# Patient Record
Sex: Male | Born: 1950 | Race: Black or African American | Hispanic: No | Marital: Married | Smoking: Never smoker
Health system: Southern US, Community
[De-identification: ages and names within clinical notes are randomized; demographics above are authoritative.]

## PROBLEM LIST (undated history)

## (undated) DIAGNOSIS — E785 Hyperlipidemia, unspecified: Secondary | ICD-10-CM

## (undated) DIAGNOSIS — I10 Essential (primary) hypertension: Secondary | ICD-10-CM

---
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
## 2015-02-01 DIAGNOSIS — I10 Essential (primary) hypertension: Secondary | ICD-10-CM | POA: Insufficient documentation

## 2015-02-01 DIAGNOSIS — E785 Hyperlipidemia, unspecified: Secondary | ICD-10-CM | POA: Insufficient documentation

## 2015-02-01 HISTORY — DX: Hyperlipidemia, unspecified: E78.5

## 2015-02-01 HISTORY — DX: Essential (primary) hypertension: I10

## 2015-08-17 DIAGNOSIS — R972 Elevated prostate specific antigen [PSA]: Secondary | ICD-10-CM | POA: Insufficient documentation

## 2015-08-17 DIAGNOSIS — N400 Enlarged prostate without lower urinary tract symptoms: Secondary | ICD-10-CM | POA: Insufficient documentation

## 2015-08-17 HISTORY — DX: Benign prostatic hyperplasia without lower urinary tract symptoms: N40.0

## 2016-02-08 DIAGNOSIS — J3089 Other allergic rhinitis: Secondary | ICD-10-CM | POA: Insufficient documentation

## 2016-03-23 ENCOUNTER — Encounter: Payer: Self-pay | Admitting: Emergency Medicine

## 2016-03-23 ENCOUNTER — Emergency Department (INDEPENDENT_AMBULATORY_CARE_PROVIDER_SITE_OTHER)
Admission: EM | Admit: 2016-03-23 | Discharge: 2016-03-23 | Disposition: A | Discharge status: Home / Self Care | Payer: Medicare Other | Source: Home / Self Care | Attending: Family Medicine | Admitting: Family Medicine

## 2016-03-23 DIAGNOSIS — B9789 Other viral agents as the cause of diseases classified elsewhere: Secondary | ICD-10-CM

## 2016-03-23 DIAGNOSIS — J069 Acute upper respiratory infection, unspecified: Secondary | ICD-10-CM

## 2016-03-23 DIAGNOSIS — R0981 Nasal congestion: Secondary | ICD-10-CM

## 2016-03-23 HISTORY — DX: Hyperlipidemia, unspecified: E78.5

## 2016-03-23 MED ORDER — IPRATROPIUM BROMIDE 0.06 % NA SOLN: 2.0000 | Freq: Four times a day (QID) | NASAL | 1 refills | Status: DC

## 2016-03-23 MED ORDER — AZITHROMYCIN 250 MG PO TABS: 250.0000 mg | ORAL_TABLET | Freq: Every day | ORAL | 0 refills | Status: DC

## 2016-03-23 MED ORDER — BENZONATATE 100 MG PO CAPS: 100.0000 mg | ORAL_CAPSULE | Freq: Three times a day (TID) | ORAL | 0 refills | Status: DC

## 2016-03-23 NOTE — Discharge Instructions (Signed)
°  For the Ipratropium nasal spray, be sure to use as prescribed to help prevent post-nasal drip, which can trigger coughing, especially at night. ° °Use 2 sprays per nostril 4 times daily while sick.  You should spray one time in each nostril pointing the spray to the out portion of your nostril, breath in slowly while spraying. Wait about 30 seconds to 1 minute before given the second spray in each nostril.  This will ensure you get the most benefit from each spray.   ° °Your symptoms are likely due to a virus such as the common cold, however, if you developing worsening chest congestion with shortness of breath, persistent fever for 3 days, or symptoms not improving in 4-5 days, you may fill the antibiotic (azithromycin).  If you do fill the antibiotic,  please take antibiotics as prescribed and be sure to complete entire course even if you start to feel better to ensure infection does not come back. ° °

## 2016-03-23 NOTE — ED Provider Notes (Signed)
CSN: [id number]     Arrival date & time 03/23/16  1822 History   First MD Initiated Contact with Patient 03/23/16 1840     Chief Complaint  Patient presents with  . URI   (Consider location/radiation/quality/duration/timing/severity/associated sxs/prior Treatment) HPI  Delvecchio MARCELL CHAVARIN is a 66 y.o. male presenting to UC with c/o 4 days of mildly persistent rhinorrhea, mild sore throat, sneezing, and chest tightness for about 4 days. He has tried OTC cough/cold medication including sudafed and mucinex w/o relief. Denies fever, chills, n/v/d. Denies body aches. He was around someone this past weekend that was sick. No other sick contacts.    Past Medical History:  Diagnosis Date  . Hyperlipidemia    History reviewed. No pertinent surgical history. No family history on file. Social History  Substance Use Topics  . Smoking status: Never Smoker  . Smokeless tobacco: Never Used  . Alcohol use Yes    Review of Systems  Constitutional: Negative for chills and fever.  HENT: Positive for congestion, rhinorrhea and sneezing. Negative for ear pain, sinus pain, sinus pressure, sore throat, trouble swallowing and voice change.   Respiratory: Positive for cough. Negative for shortness of breath.   Cardiovascular: Negative for chest pain and palpitations.  Gastrointestinal: Negative for abdominal pain, diarrhea, nausea and vomiting.  Musculoskeletal: Negative for arthralgias, back pain and myalgias.  Skin: Negative for rash.  Neurological: Negative for dizziness, light-headedness and headaches.    Allergies  Patient has no known allergies.  Home Medications   Prior to Admission medications   Medication Sig Start Date End Date Taking? Authorizing Provider  aspirin EC 81 MG tablet Take 81 mg by mouth daily.   Yes Historical Provider, MD  atorvastatin (LIPITOR) 40 MG tablet Take 40 mg by mouth daily.   Yes Historical Provider, MD  budesonide-formoterol (SYMBICORT) 80-4.5 MCG/ACT inhaler  Inhale 2 puffs into the lungs 2 (two) times daily.   Yes Historical Provider, MD  guaiFENesin (MUCINEX) 600 MG 12 hr tablet Take by mouth 2 (two) times daily.   Yes Historical Provider, MD  hydrochlorothiazide (HYDRODIURIL) 25 MG tablet Take 25 mg by mouth daily.   Yes Historical Provider, MD  azithromycin (ZITHROMAX) 250 MG tablet Take 1 tablet (250 mg total) by mouth daily. Take first 2 tablets together, then 1 every day until finished. 03/23/16   Junius Finner, PA-C  benzonatate (TESSALON) 100 MG capsule Take 1-2 capsules (100-200 mg total) by mouth every 8 (eight) hours. 03/23/16   Junius Finner, PA-C  ipratropium (ATROVENT) 0.06 % nasal spray Place 2 sprays into both nostrils 4 (four) times daily. For 1 week 03/23/16   Junius Finner, PA-C   Meds Ordered and Administered this Visit  Medications - No data to display  BP 160/80 (BP Location: Left Arm)   Pulse 75   Temp 97.9 F (36.6 C) (Oral)   Ht 6' (1.829 m)   Wt 236 lb (107 kg)   SpO2 96%   BMI 32.01 kg/m  No data found.   Physical Exam  Constitutional: He is oriented to person, place, and time. He appears well-developed and well-nourished. No distress.  HENT:  Head: Normocephalic and atraumatic.  Right Ear: Tympanic membrane normal.  Left Ear: Tympanic membrane normal.  Nose: Mucosal edema and rhinorrhea present. Right sinus exhibits no maxillary sinus tenderness and no frontal sinus tenderness. Left sinus exhibits no maxillary sinus tenderness and no frontal sinus tenderness.  Mouth/Throat: Uvula is midline, oropharynx is clear and moist and mucous membranes are  normal.  Eyes: EOM are normal.  Neck: Normal range of motion. Neck supple.  Cardiovascular: Normal rate and regular rhythm.   Pulmonary/Chest: Effort normal and breath sounds normal. No stridor. No respiratory distress. He has no wheezes. He has no rales.  Musculoskeletal: Normal range of motion.  Lymphadenopathy:    He has no cervical adenopathy.  Neurological: He is  alert and oriented to person, place, and time.  Skin: Skin is warm and dry. He is not diaphoretic.  Psychiatric: He has a normal mood and affect. His behavior is normal.  Nursing note and vitals reviewed.   Urgent Care Course     Procedures (including critical care time)  Labs Review Labs Reviewed - No data to display  Imaging Review No results found.    MDM   1. Viral URI with cough   2. Sinus congestion    Symptoms likely viral, no evidence of bacterial infection at this time.  Encouraged symptomatic treatment.  Rx: tessalon, ipratropium nasal spray. Prescription to hold with expiration date for azithromycin. Pt to fill if persistent fever develops or not improving in 1 week.     Junius Finnerrin O'Malley, PA-C 03/23/16 [phone]

## 2016-03-23 NOTE — ED Triage Notes (Signed)
Runny nose, congestion, sneezing, cough, chest tightness x 4 days

## 2018-07-04 ENCOUNTER — Emergency Department (INDEPENDENT_AMBULATORY_CARE_PROVIDER_SITE_OTHER)
Admission: EM | Admit: 2018-07-04 | Discharge: 2018-07-04 | Disposition: A | Discharge status: Home / Self Care | Payer: Medicare Other | Source: Home / Self Care | Attending: Family Medicine | Admitting: Family Medicine

## 2018-07-04 ENCOUNTER — Other Ambulatory Visit: Payer: Self-pay

## 2018-07-04 ENCOUNTER — Encounter: Payer: Self-pay | Admitting: Emergency Medicine

## 2018-07-04 ENCOUNTER — Emergency Department (INDEPENDENT_AMBULATORY_CARE_PROVIDER_SITE_OTHER): Payer: Medicare Other

## 2018-07-04 DIAGNOSIS — M4317 Spondylolisthesis, lumbosacral region: Secondary | ICD-10-CM

## 2018-07-04 DIAGNOSIS — M5442 Lumbago with sciatica, left side: Secondary | ICD-10-CM

## 2018-07-04 MED ORDER — PREDNISONE 20 MG PO TABS: ORAL_TABLET | ORAL | 0 refills | Status: DC

## 2018-07-04 MED ORDER — HYDROCODONE-ACETAMINOPHEN 5-325 MG PO TABS: ORAL_TABLET | ORAL | 0 refills | Status: DC

## 2018-07-04 NOTE — ED Triage Notes (Signed)
Left side sciatica x 6 days

## 2018-07-04 NOTE — Discharge Instructions (Addendum)
Apply ice pack to lower back for 20 to 30 minutes, 3 to 4 times daily  Continue until pain decreases.  May take Tylenol daytime as needed for pain.

## 2018-07-04 NOTE — ED Provider Notes (Signed)
[facility]    CSN: [id number] Arrival date & time: 07/04/18  0901     History   Chief Complaint Chief Complaint  Patient presents with  . Sciatica    HPI Dillon Jackson is a 68 y.o. male.   Patient reports that after working in his yard five days ago he developed left-side lower back ache that began radiating into his left anterior thigh.  The pain is better when sitting and worse when walking.  He denies bowel or bladder dysfunction, and no saddle numbness.  Aleve has not been helpful.  He feels well otherwise. He states that he had sciatic pain about 10 years ago.   The history is provided by the patient.  Back Pain  Location:  Lumbar spine and gluteal region Quality:  Aching Radiates to:  L thigh Pain severity:  Moderate Pain is:  Worse during the night Onset quality:  Sudden Duration:  5 days Timing:  Constant Progression:  Unchanged Chronicity:  Recurrent Context comment:  Doing yard work Relieved by:  Nothing Exacerbated by: walking. Ineffective treatments:  NSAIDs Associated symptoms: leg pain and paresthesias   Associated symptoms: no abdominal pain, no bladder incontinence, no bowel incontinence, no chest pain, no dysuria, no fever, no numbness, no pelvic pain, no perianal numbness, no tingling, no weakness and no weight loss     Past Medical History:  Diagnosis Date  . Hyperlipidemia     Active problems:  Hyperlipidemia, essential hypertension, BPH, allergic rhinitis.   Surgical history:  Not listed by patient.     Home Medications    Prior to Admission medications   Medication Sig Start Date End Date Taking? Authorizing Provider  aspirin EC 81 MG tablet Take 81 mg by mouth daily.    [provider]  atorvastatin (LIPITOR) 40 MG tablet Take 40 mg by mouth daily.    [provider]  budesonide-formoterol (SYMBICORT) 80-4.5 MCG/ACT inhaler Inhale 2 puffs into the lungs 2 (two) times daily.    [provider]   hydrochlorothiazide (HYDRODIURIL) 25 MG tablet Take 25 mg by mouth daily.    [provider]  HYDROcodone-acetaminophen (NORCO/VICODIN) 5-325 MG tablet Take one by mouth at bedtime as needed for pain.  May repeat in 4 to 6 hours prn 07/04/18   Lattie Haw, MD  predniSONE (DELTASONE) 20 MG tablet Take one tab by mouth twice daily for 4 days, then one daily for 3 days. Take with food. 07/04/18   Lattie Haw, MD    Family History Prostate CA  Social History Social History   Tobacco Use  . Smoking status: Never Smoker  . Smokeless tobacco: Never Used  Substance Use Topics  . Alcohol use: Yes  . Drug use: Not on file     Allergies   Patient has no known allergies.   Review of Systems Review of Systems  Constitutional: Negative for chills, diaphoresis, fatigue, fever and weight loss.  Cardiovascular: Negative for chest pain.  Gastrointestinal: Negative for abdominal pain and bowel incontinence.  Genitourinary: Negative for bladder incontinence, dysuria and pelvic pain.  Musculoskeletal: Positive for back pain.  Neurological: Positive for paresthesias. Negative for tingling, weakness and numbness.  All other systems reviewed and are negative.    Physical Exam Triage Vital Signs ED Triage Vitals  Enc Vitals Group     BP      Pulse      Resp      Temp      Temp  src      SpO2      Weight      Height      Head Circumference      Peak Flow      Pain Score      Pain Loc      Pain Edu?      Excl. in GC?    No data found.  Updated Vital Signs BP (!) 164/84 (BP Location: Right Arm)   Pulse (!) 52   Temp 98.5 F (36.9 C) (Oral)   Ht 6' (1.829 m)   Wt 104.3 kg   SpO2 98%   BMI 31.19 kg/m   Visual Acuity Right Eye Distance:   Left Eye Distance:   Bilateral Distance:    Right Eye Near:   Left Eye Near:    Bilateral Near:     Physical Exam Vitals signs and nursing note reviewed.  Constitutional:      General: He is not in acute distress.  HENT:     Head: Normocephalic.     Right Ear: External ear normal.     Left Ear: External ear normal.     Nose: Nose normal.     Mouth/Throat:     Pharynx: Oropharynx is clear.  Eyes:     Conjunctiva/sclera: Conjunctivae normal.     Pupils: Pupils are equal, round, and reactive to light.  Neck:     Musculoskeletal: Normal range of motion.  Cardiovascular:     Rate and Rhythm: Normal rate.     Heart sounds: Normal heart sounds.  Pulmonary:     Effort: Pulmonary effort is normal.     Breath sounds: Normal breath sounds.  Abdominal:     Tenderness: There is no abdominal tenderness.  Musculoskeletal:       Back:     Right lower leg: No edema.     Left lower leg: No edema.     Comments: Back:  Range of motion decreased. Can heel/toe walk and squat without difficulty. Tenderness in the midline and left paraspinous muscles from L4 to L5.  Straight leg raising test is positive on the left at 30 degrees.  Sitting knee extension test is positive at 45 degrees. Strength and sensation in the lower extremities is normal.  Patellar and achilles reflexes are normal   Skin:    General: Skin is warm and dry.     Findings: No rash.       Neurological:     Mental Status: He is alert.      UC Treatments / Results  Labs (all labs ordered are listed, but only abnormal results are displayed) Labs Reviewed - No data to display  EKG None  Radiology Dg Lumbar Spine Complete  Result Date: 07/04/2018 CLINICAL DATA:  Low back pain for several days, no known injury, initial encounter EXAM: LUMBAR SPINE - COMPLETE 4+ VIEW COMPARISON:  None. FINDINGS: Five lumbar type vertebral bodies are well visualized. Vertebral body height is well maintained. Mild osteophytic changes are noted. 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 Aortic calcifications are noted. IMPRESSION: L5 anterolisthesis on S1 secondary to pars defects. Electronically Signed   By: Alcide Clever M.D.   On:  07/04/2018 10:51    Procedures Procedures (including critical care time)  Medications Ordered in UC Medications - No data to display  Initial Impression / Assessment and Plan / UC Course  I have reviewed the triage vital signs and the nursing notes.  Pertinent labs & imaging results that were available during my care of the patient were reviewed by me and considered in my medical decision making (see chart for details).    Begin prednisone burst/taper. Rx for Lortab at bedtime prn (#10, no refill) Followup with Dr. Rodney Langtonhomas Thekkekandam or Dr. Clementeen GrahamEvan Corey ([facility]) if not improving about two weeks.    Final Clinical Impressions(s) / UC Diagnoses   Final diagnoses:  Acute left-sided low back pain with left-sided sciatica     Discharge Instructions     Apply ice pack to lower back for 20 to 30 minutes, 3 to 4 times daily  Continue until pain decreases.  May take Tylenol daytime as needed for pain.    ED Prescriptions    Medication Sig Dispense Auth. Provider   predniSONE (DELTASONE) 20 MG tablet Take one tab by mouth twice daily for 4 days, then one daily for 3 days. Take with food. 11 tablet Lattie HawBeese, Stephen A, MD   HYDROcodone-acetaminophen (NORCO/VICODIN) 5-325 MG tablet Take one by mouth at bedtime as needed for pain.  May repeat in 4 to 6 hours prn 10 tablet Cathren HarshBeese, Tera MaterStephen A, MD         Lattie HawBeese, Stephen A, MD 07/08/18 1115

## 2018-07-13 ENCOUNTER — Emergency Department (INDEPENDENT_AMBULATORY_CARE_PROVIDER_SITE_OTHER)
Admission: EM | Admit: 2018-07-13 | Discharge: 2018-07-13 | Disposition: A | Discharge status: Home / Self Care | Payer: Medicare Other | Source: Home / Self Care | Attending: Family Medicine | Admitting: Family Medicine

## 2018-07-13 ENCOUNTER — Other Ambulatory Visit: Payer: Self-pay

## 2018-07-13 DIAGNOSIS — M5442 Lumbago with sciatica, left side: Secondary | ICD-10-CM | POA: Diagnosis not present

## 2018-07-13 MED ORDER — KETOROLAC TROMETHAMINE 60 MG/2ML IM SOLN
60.0000 mg | Freq: Once | INTRAMUSCULAR | Status: AC
  Administered 2018-07-13: 60 mg via INTRAMUSCULAR

## 2018-07-13 MED ORDER — CYCLOBENZAPRINE HCL 10 MG PO TABS: 10.0000 mg | ORAL_TABLET | Freq: Three times a day (TID) | ORAL | 1 refills | Status: AC

## 2018-07-13 MED ORDER — OXYCODONE-ACETAMINOPHEN 5-325 MG PO TABS: 1.0000 | ORAL_TABLET | Freq: Four times a day (QID) | ORAL | 0 refills | Status: DC | PRN

## 2018-07-13 NOTE — Discharge Instructions (Addendum)
Apply heating pad several times daily as needed. Take Ibuprofen 200mg , 4 tabs every 8 hours with food.

## 2018-07-13 NOTE — ED Provider Notes (Signed)
[facility]    CSN: [id number] Arrival date & time: 07/13/18  0906     History   Chief Complaint Chief Complaint  Patient presents with  . Back Pain    HPI Dillon Jackson is a 68 y.o. male.   Patient had been evaluated/treated here 9 days ago for acute left low back pain with left sciatica.  He has not improved after a prednisone burst/taper and Norco at bedtime.  He has also tried application of heat and ice without improvement.  He denies bowel or bladder dysfunction, and no saddle numbness.   The history is provided by the patient.    Past Medical History:  Diagnosis Date  . Hyperlipidemia     There are no active problems to display for this patient.   History reviewed. No pertinent surgical history.     Home Medications    Prior to Admission medications   Medication Sig Start Date End Date Taking? Authorizing Provider  aspirin EC 81 MG tablet Take 81 mg by mouth daily.    [provider]  atorvastatin (LIPITOR) 40 MG tablet Take 40 mg by mouth daily.    [provider]  budesonide-formoterol (SYMBICORT) 80-4.5 MCG/ACT inhaler Inhale 2 puffs into the lungs 2 (two) times daily.    [provider]  cyclobenzaprine (FLEXERIL) 10 MG tablet Take 1 tablet (10 mg total) by mouth 3 (three) times daily. 07/13/18   Lattie Haw, MD  hydrochlorothiazide (HYDRODIURIL) 25 MG tablet Take 25 mg by mouth daily.    [provider]  HYDROcodone-acetaminophen (NORCO/VICODIN) 5-325 MG tablet Take one by mouth at bedtime as needed for pain.  May repeat in 4 to 6 hours prn 07/04/18   Lattie Haw, MD  oxyCODONE-acetaminophen (PERCOCET) 5-325 MG tablet Take 1 tablet by mouth every 6 (six) hours as needed for up to 5 days for moderate pain or severe pain. 07/13/18 07/18/18  Lattie Haw, MD  predniSONE (DELTASONE) 20 MG tablet Take one tab by mouth twice daily for 4 days, then one daily for 3 days. Take with food. 07/04/18   Lattie Haw, MD    Family History History reviewed. No pertinent family history.  Social History Social History   Tobacco Use  . Smoking status: Never Smoker  . Smokeless tobacco: Never Used  Substance Use Topics  . Alcohol use: Yes  . Drug use: Not on file     Allergies   Patient has no known allergies.   Review of Systems Review of Systems  Constitutional: Positive for activity change. Negative for appetite change, chills, diaphoresis, fatigue, fever and unexpected weight change.  HENT: Negative.   Eyes: Negative.   Respiratory: Negative.   Cardiovascular: Negative.   Gastrointestinal: Negative.   Genitourinary: Negative.   Musculoskeletal: Positive for back pain.  Skin: Negative.   Neurological:       Radicular pain left posterior thigh     Physical Exam Triage Vital Signs ED Triage Vitals  Enc Vitals Group     BP 07/13/18 0932 (!) 155/71     Pulse Rate 07/13/18 0932 74     Resp 07/13/18 0932 16     Temp 07/13/18 0932 98.1 F (36.7 C)     Temp Source 07/13/18 0932 Oral     SpO2 07/13/18 0932 97 %     Weight 07/13/18 0933 247 lb (112 kg)     Height 07/13/18 0933 6' (1.829 m)     Head Circumference --  Peak Flow --      Pain Score 07/13/18 0933 10     Pain Loc --      Pain Edu? --      Excl. in GC? --    No data found.  Updated Vital Signs BP (!) 155/71 (BP Location: Right Arm)   Pulse 74   Temp 98.1 F (36.7 C) (Oral)   Resp 16   Ht 6' (1.829 m)   Wt 112 kg   SpO2 97%   BMI 33.50 kg/m   Visual Acuity Right Eye Distance:   Left Eye Distance:   Bilateral Distance:    Right Eye Near:   Left Eye Near:    Bilateral Near:     Physical Exam Vitals signs and nursing note reviewed.  Constitutional:      General: He is not in acute distress. HENT:     Head: Normocephalic.     Right Ear: External ear normal.     Left Ear: External ear normal.     Nose: Nose normal.     Mouth/Throat:     Mouth: Mucous membranes are moist.  Eyes:      Pupils: Pupils are equal, round, and reactive to light.  Neck:     Musculoskeletal: Normal range of motion.  Cardiovascular:     Heart sounds: Normal heart sounds.  Pulmonary:     Breath sounds: Normal breath sounds.  Abdominal:     Tenderness: There is no abdominal tenderness.  Musculoskeletal:     Lumbar back: He exhibits decreased range of motion and tenderness.       Back:     Comments: Back:  Range of motion decreased. Can heel/toe walk and squat without difficulty. Tenderness in the midline and left paraspinous muscles from L4 to L5.  Straight leg raising test is positive on the left at 30 degrees.  Sitting knee extension test is positive at 45 degrees. Strength and sensation in the lower extremities is normal.  Patellar and achilles reflexes are normal  Skin:    General: Skin is warm and dry.     Findings: No rash.  Neurological:     Mental Status: He is alert.      UC Treatments / Results  Labs (all labs ordered are listed, but only abnormal results are displayed) Labs Reviewed - No data to display  EKG None  Radiology No results found.  Procedures Procedures (including critical care time)  Medications Ordered in UC Medications  ketorolac (TORADOL) injection 60 mg (60 mg Intramuscular Given 07/13/18 1010)    Initial Impression / Assessment and Plan / UC Course  I have reviewed the triage vital signs and the nursing notes.  Pertinent labs & imaging results that were available during my care of the patient were reviewed by me and considered in my medical decision making (see chart for details).    Administered Toradol  IM. Rx for Percocet (#15, no refill).  Rx for Flexeril . Controlled Substance Prescriptions I have consulted the  Controlled Substances Registry for this patient, and feel the risk/benefit ratio today is favorable for proceeding with this prescription for a controlled substance.   Followup with Dr. Rodney Langton or Dr. Clementeen Graham ([facility]) as soon as possible for further evaluation/treatment.   Final Clinical Impressions(s) / UC Diagnoses   Final diagnoses:  Left-sided low back pain with left-sided sciatica, unspecified chronicity     Discharge Instructions     Apply heating pad several times  daily as needed. Take Ibuprofen 200mg , 4 tabs every 8 hours with food.     ED Prescriptions    Medication Sig Dispense Auth. Provider   oxyCODONE-acetaminophen (PERCOCET) 5-325 MG tablet Take 1 tablet by mouth every 6 (six) hours as needed for up to 5 days for moderate pain or severe pain. 15 tablet Lattie HawBeese, Jessica Checketts A, MD   cyclobenzaprine (FLEXERIL) 10 MG tablet Take 1 tablet (10 mg total) by mouth 3 (three) times daily. 20 tablet Lattie HawBeese, Jaramie Bastos A, MD         Lattie HawBeese, Locklan Canoy A, MD 07/14/18 [phone]

## 2018-07-13 NOTE — ED Triage Notes (Signed)
Pt here today for continued back pain that shoots down his LT leg. Was here about 1.5 weeks ago for same issue. Was given prednisone and norco/vicodin. Says rx provided no relief. Has also tried heating pads and icing areas.

## 2018-07-16 ENCOUNTER — Encounter: Payer: Self-pay | Admitting: Family Medicine

## 2018-07-16 ENCOUNTER — Ambulatory Visit (INDEPENDENT_AMBULATORY_CARE_PROVIDER_SITE_OTHER): Payer: Medicare Other | Admitting: Family Medicine

## 2018-07-16 VITALS — BP 158/79 | HR 80 | Temp 98.8°F | Wt 249.0 lb

## 2018-07-16 DIAGNOSIS — S39012A Strain of muscle, fascia and tendon of lower back, initial encounter: Secondary | ICD-10-CM | POA: Diagnosis not present

## 2018-07-16 DIAGNOSIS — M4316 Spondylolisthesis, lumbar region: Secondary | ICD-10-CM | POA: Diagnosis not present

## 2018-07-16 HISTORY — DX: Spondylolisthesis, lumbar region: M43.16

## 2018-07-16 MED ORDER — GABAPENTIN 300 MG PO CAPS: ORAL_CAPSULE | ORAL | 3 refills | Status: AC

## 2018-07-16 MED ORDER — OXYCODONE-ACETAMINOPHEN 5-325 MG PO TABS: 1.0000 | ORAL_TABLET | Freq: Three times a day (TID) | ORAL | 0 refills | Status: AC | PRN

## 2018-07-16 NOTE — Patient Instructions (Signed)
Thank you for coming in today. COntinue oxycodone sparingly.  Try using 2x daily or less.  Start gabapentin. Ok to increase faster than the bottle says.  Attend PT.  If not improving recheck in 2 weeks to proceed to MRI. Come back or go to the emergency room if you notice new weakness new numbness problems walking or bowel or bladder problems. Ibuprofen dose max of 800 mg  (4 of the over the counter dose) every 8 hours.    Radicular Pain Radicular pain is a type of pain that spreads from your back or neck along a spinal nerve. Spinal nerves are nerves that leave the spinal cord and go to the muscles. Radicular pain is sometimes called radiculopathy, radiculitis, or a pinched nerve. When you have this type of pain, you may also have weakness, numbness, or tingling in the area of your body that is supplied by the nerve. The pain may feel sharp and burning. Depending on which spinal nerve is affected, the pain may occur in the:  Neck area (cervical radicular pain). You may also feel pain, numbness, weakness, or tingling in the arms.  Mid-spine area (thoracic radicular pain). You would feel this pain in the back and chest. This type is rare.  Lower back area (lumbar radicular pain). You would feel this pain as low back pain. You may feel pain, numbness, weakness, or tingling in the buttocks or legs. Sciatica is a type of lumbar radicular pain that shoots down the back of the leg. Radicular pain occurs when one of the spinal nerves becomes irritated or squeezed (compressed). It is often caused by something pushing on a spinal nerve, such as one of the bones of the spine (vertebrae) or one of the round cushions between vertebrae (intervertebral disks). This can result from:  An injury.  Wear and tear or aging of a disk.  The growth of a bone spur that pushes on the nerve. Radicular pain often goes away when you follow instructions from your health care provider for relieving pain at home. Follow  these instructions at home: Managing pain      If directed, put ice on the affected area: ? Put ice in a plastic bag. ? Place a towel between your skin and the bag. ? Leave the ice on for 20 minutes, 2-3 times a day.  If directed, apply heat to the affected area as often as told by your health care provider. Use the heat source that your health care provider recommends, such as a moist heat pack or a heating pad. ? Place a towel between your skin and the heat source. ? Leave the heat on for 20-30 minutes. ? Remove the heat if your skin turns bright red. This is especially important if you are unable to feel pain, heat, or cold. You may have a greater risk of getting burned. Activity   Do not sit or rest in bed for long periods of time.  Try to stay as active as possible. Ask your health care provider what type of exercise or activity is best for you.  Avoid activities that make your pain worse, such as bending and lifting.  Do not lift anything that is heavier than 10 lb (4.5 kg), or the limit that you are told, until your health care provider says that it is safe.  Practice using proper technique when lifting items. Proper lifting technique involves bending your knees and rising up.  Do strength and range-of-motion exercises only as told by  your health care provider or physical therapist. General instructions  Take over-the-counter and prescription medicines only as told by your health care provider.  Pay attention to any changes in your symptoms.  Keep all follow-up visits as told by your health care provider. This is important. ? Your health care provider may send you to a physical therapist to help with this pain. Contact a health care provider if:  Your pain and other symptoms get worse.  Your pain medicine is not helping.  Your pain has not improved after a few weeks of home care.  You have a fever. Get help right away if:  You have severe pain, weakness, or  numbness.  You have difficulty with bladder or bowel control. Summary  Radicular pain is a type of pain that spreads from your back or neck along a spinal nerve.  When you have radicular pain, you may also have weakness, numbness, or tingling in the area of your body that is supplied by the nerve.  The pain may feel sharp or burning.  Radicular pain may be treated with ice, heat, medicines, or physical therapy. This information is not intended to replace advice given to you by your health care provider. Make sure you discuss any questions you have with your health care provider. Document Released: 03/16/2004 Document Revised: 08/21/2017 Document Reviewed: 08/21/2017 Elsevier Interactive Patient Education  2019 [employer]

## 2018-07-16 NOTE — Progress Notes (Signed)
Subjective:    CC: Back pain left lower.   HPI: Low back pain.  Patient has a several week history of left-sided low back pain radiating down left anterior thigh.  Pain is worse with standing and extension.  He notes pain is worse in the morning when he tries to stand up.  He notes pain is better with sitting in flexed position.  He was seen in urgent care on May 14 and again on May 23.  In the 14th he had x-rays showing L5 anterolisthesis on S1 secondary to pars defects.  He was prescribed prednisone.  He notes this did not help.  On follow-up on the 23rd he was given Flexeril and oxycodone which has helped.  He denies bowel bladder dysfunction weakness or numbness distally.  No fevers or chills. l.   Past medical history, Surgical history, Family history not pertinant except as noted below, Social history, Allergies, and medications have been entered into the medical record, reviewed, and no changes needed.   Review of Systems: No headache, visual changes, nausea, vomiting, diarrhea, constipation, dizziness, abdominal pain, skin rash, fevers, chills, night sweats, weight loss, swollen lymph nodes, body aches, joint swelling, muscle aches, chest pain, shortness of breath, mood changes, visual or auditory hallucinations.   Objective:    Vitals:   07/16/18 1111  BP: (!) 158/79  Pulse: 80  Temp: 98.8 F (37.1 C)   General: Well Developed, well nourished, and in no acute distress.  Neuro/Psych: Alert and oriented x3, extra-ocular muscles intact, able to move all 4 extremities, sensation grossly intact. Skin: Warm and dry, no rashes noted.  Respiratory: Not using accessory muscles, speaking in full sentences, trachea midline.  Cardiovascular: Pulses palpable, no extremity edema. Abdomen: Does not appear distended. MSK:  L-spine: Nontender to spinal midline.  Tender palpation bilateral lumbar paraspinal musculature. Range of motion significantly limited extension.  Normal flexion.   Reduced rotation and lateral flexion bilaterally. Lower extremity strength reflexes and sensation are equal normal throughout.  Lab and Radiology Results Dg Lumbar Spine Complete  Result Date: 07/04/2018 CLINICAL DATA:  Low back pain for several days, no known injury, initial encounter EXAM: LUMBAR SPINE - COMPLETE 4+ VIEW COMPARISON:  None. FINDINGS: Five lumbar type vertebral bodies are well visualized. Vertebral body height is well maintained. Mild osteophytic changes are noted. 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 Aortic calcifications are noted. IMPRESSION: L5 anterolisthesis on S1 secondary to pars defects. Electronically Signed   By: Alcide Clever M.D.   On: 07/04/2018 10:51   I personally (independently) visualized and performed the interpretation of the images attached in this note. My read also concerning for some anterior listhesis at L2 -L3  Impression and Recommendations:    Assessment and Plan: 68 y.o. male with  Low back pain with left leg radiating pain likely L3 dermatome. Patient does have spondylolisthesis at L5-S1 but his radicular pain would be more likely at the L3 dermatome.  The majority of his pain today is likely due to myofascial spasms.  Plan for physical therapy and gabapentin.  Limited refill oxycodone.  Recheck in near future if not improving.  Recheck in a few weeks.  Precautions reviewed.Marland Kitchen  PDMP reviewed during this encounter. Orders Placed This Encounter  Procedures  . Ambulatory referral to Physical Therapy    Referral Priority:   Routine    Referral Type:   Physical Medicine    Referral Reason:   Specialty Services Required  Requested Specialty:   Physical Therapy   Meds ordered this encounter  Medications  . oxyCODONE-acetaminophen (PERCOCET) 5-325 MG tablet    Sig: Take 1 tablet by mouth every 8 (eight) hours as needed for up to 5 days for moderate pain or severe pain.    Dispense:  15 tablet    Refill:  0  .  gabapentin (NEURONTIN) 300 MG capsule    Sig: One tab PO qHS for a week, then BID for a week, then TID. May double weekly to a max of 3,600mg /day    Dispense:  180 capsule    Refill:  3    Discussed warning signs or symptoms. Please see discharge instructions. Patient expresses understanding.

## 2018-07-17 ENCOUNTER — Encounter: Payer: Self-pay | Admitting: Physical Therapy

## 2018-07-17 ENCOUNTER — Ambulatory Visit (INDEPENDENT_AMBULATORY_CARE_PROVIDER_SITE_OTHER): Payer: Medicare Other | Admitting: Physical Therapy

## 2018-07-17 ENCOUNTER — Other Ambulatory Visit: Payer: Self-pay

## 2018-07-17 DIAGNOSIS — M5442 Lumbago with sciatica, left side: Secondary | ICD-10-CM

## 2018-07-17 DIAGNOSIS — R29898 Other symptoms and signs involving the musculoskeletal system: Secondary | ICD-10-CM

## 2018-07-17 DIAGNOSIS — R293 Abnormal posture: Secondary | ICD-10-CM | POA: Diagnosis not present

## 2018-07-17 NOTE — Therapy (Signed)
[facility] [address] [place], [place], [address] Phone: [phone]   Fax:  [phone]  Physical Therapy Evaluation  Patient Details  Name: Dillon Jackson MRN: [id number] Date of Birth: Aug 03, 1950 Referring Provider (PT): Rodolph Bongorey, Evan S, MD   Encounter Date: 07/17/2018  PT End of Session - 07/17/18 0949    Visit Number  1    Number of Visits  12    Date for PT Re-Evaluation  08/28/18    Authorization Type  Medicare/Tricare    PT Start Time  0902    PT Stop Time  0940    PT Time Calculation (min)  38 min    Activity Tolerance  Patient tolerated treatment well    Behavior During Therapy  [facility] for tasks assessed/performed       Past Medical History:  Diagnosis Date  . BPH without obstruction/lower urinary tract symptoms 08/17/2015  . Essential hypertension 02/01/2015  . Hyperlipidemia   . Hyperlipidemia with target LDL less than 100 02/01/2015  . Spondylolisthesis of lumbar region 07/16/2018    History reviewed. No pertinent surgical history.  There were no vitals filed for this visit.   Subjective Assessment - 07/17/18 0903    Subjective  Pt is a 68 y/o male who presents to OPPT for LBP radiating into LLE x 2-3 weeks.  Pt without known injury at this time.  Pt reports pain worse at night and in the morning.   Pt went to UC x 2 and prescribed steroids which were not beneficial.  Recent rx of gabapentin has been ineffective as well.    Limitations  Standing;Walking    How long can you stand comfortably?  1-2 min    How long can you walk comfortably?  unable to walk comfortably    Patient Stated Goals  walk and stand without pain    Currently in Pain?  Yes    Pain Score  5    up to 10/10; at best 5/10   Pain Location  Back    Pain Orientation  Left    Pain Descriptors / Indicators  Radiating;Sharp;Aching;Dull;Constant    Pain Type  Acute pain    Pain Onset  1 to 4 weeks ago    Pain Frequency  Constant     Aggravating Factors   standing, walking    Pain Relieving Factors  highly irritable, settles quickly, heat, cold    Effect of Pain on Daily Activities  sleeping         [facility] Assessment - 07/17/18 0909      Assessment   Medical Diagnosis  S39.012A (ICD-10-CM) - Lumbosacral strain, initial encounter    Referring Provider (PT)  Rodolph Bongorey, Evan S, MD    Onset Date/Surgical Date  06/30/18    Next MD Visit  PRN (2 weeks if no improvement)    Prior Therapy  none      Precautions   Precautions  None      Restrictions   Weight Bearing Restrictions  No      Balance Screen   Has the patient fallen in the past 6 months  No    Has the patient had a decrease in activity level because of a fear of falling?   No    Is the patient reluctant to leave their home because of a fear of falling?   No      Home Nurse, mental healthnvironment   Living Environment  Private residence    Living  Arrangements  Spouse/significant other    Type of Home  House    Home Access  Level entry    Home Layout  Two level    Alternate Level Stairs-Number of Steps  16    Alternate Level Stairs-Rails  Right    Additional Comments  denies difficlty with stairs; reports it actually helps      Prior Function   Level of Independence  Independent    Vocation  Retired    [employer]  retired from [employer]ologist, [employer]    Leisure  yardwork, walking 2-3 miles/day      Cognition   Overall Cognitive Status  Within Functional Limits for tasks assessed      Observation/Other Assessments   Focus on Therapeutic Outcomes (FOTO)   46 (54% limited; predicted 35% limited)      Posture/Postural Control   Posture/Postural Control  Postural limitations    Postural Limitations  Rounded Shoulders;Forward head;Flexed trunk;Decreased lumbar lordosis      ROM / Strength   AROM / PROM / Strength  AROM;Strength      AROM   Overall AROM Comments  Lt sidebending limited 25%; pain with Rt rotation on Lt side      Strength    Strength Assessment Site  Hip;Knee;Ankle    Right/Left Hip  Right;Left    Right Hip Flexion  5/5    Right Hip ABduction  5/5    Left Hip Flexion  5/5    Left Hip ABduction  5/5   difficulty getting into testing position   Right/Left Knee  Right;Left    Right Knee Extension  5/5    Left Knee Extension  5/5    Right/Left Ankle  Right;Left    Right Ankle Dorsiflexion  5/5    Left Ankle Dorsiflexion  5/5      Flexibility   Soft Tissue Assessment /Muscle Length  yes   hip flexor tightness bil   Hamstrings  tightness bil    Piriformis  tightness bil      Palpation   Palpation comment  trigger points and tenderness noted with palpation along Lt lumbar paraspinals, QL, and into glutes/piriformis      Special Tests    Special Tests  Lumbar    Lumbar Tests  Slump Test      Slump test   Findings  Negative    Side  Left    Comment  prone on elbows x 2 min; no change in symptoms with radicular symptoms (very inflexible with difficulty getting into position)      Ambulation/Gait   Gait Comments  amb with flexed trunk and decreased gait velocity                Objective measurements completed on examination: See above findings.      [facility] Adult PT Treatment/Exercise - 07/17/18 0909      Exercises   Exercises  Lumbar      Lumbar Exercises: Stretches   Passive Hamstring Stretch  Right;Left;1 rep;30 seconds    Passive Hamstring Stretch Limitations  supine with strap    Single Knee to Chest Stretch  Right;Left;1 rep;30 seconds    Piriformis Stretch  Right;Left;1 rep;30 seconds             PT Education - 07/17/18 0949    Education Details  HEP, DN    Person(s) Educated  Patient    Methods  Explanation;Demonstration;Handout    Comprehension  Verbalized understanding;Returned demonstration;Need further instruction  PT Long Term Goals - 07/17/18 0952      PT LONG TERM GOAL #1   Title  independent with HEP    Status  New    Target Date  08/28/18       PT LONG TERM GOAL #2   Title  FOTO score improved to </= 35% limited for improved function    Status  New    Target Date  08/28/18      PT LONG TERM GOAL #3   Title  perform lumbar ROM without increase in pain for improved function    Status  New    Target Date  08/28/18      PT LONG TERM GOAL #4   Title  demonstrate improved standing and walking posture without increase in pain for improved function    Status  New    Target Date  08/28/18      PT LONG TERM GOAL #5   Title  report 75% improvement in sleep for improved function    Status  New    Target Date  08/28/18             Plan - 07/17/18 0949    Clinical Impression Statement  Pt is a 68 y/o male who presents to OPPT for acute LBP radiating to Lt knee.  Pt demonstrates gait abnormalities, decreased ROM, flexibility and increased pain with most activities.  Pt will benefit from PT to maximize function.    Examination-Activity Limitations  Bed Mobility;Locomotion Level;Stand;Sleep    Examination-Participation Restrictions  Yard Work    Stability/Clinical Decision Making  Stable/Uncomplicated    Clinical Decision Making  Low    Rehab Potential  Good    PT Frequency  2x / week    PT Duration  6 weeks    PT Treatment/Interventions  ADLs/Self Care Home Management;Cryotherapy;Ultrasound;Traction;Moist Heat;Electrical Stimulation;Gait training;Functional mobility training;Neuromuscular re-education;Therapeutic exercise;Therapeutic activities;Patient/family education;Manual techniques;Dry needling;Taping;Spinal Manipulations    PT Next Visit Plan  lumbar manipulation, DN/manual therapy to Lt glutes/QL/paraspinals, review HEP and progress as able, modalities PRN    PT Home Exercise Plan  Access Code: [email or web address]    Consulted and Agree with Plan of Care  Patient       Patient will benefit from skilled therapeutic intervention in order to improve the following deficits and impairments:  Abnormal gait, Increased fascial  restricitons, Increased muscle spasms, Pain, Improper body mechanics, Postural dysfunction, Decreased mobility, Decreased range of motion, Impaired flexibility, Difficulty walking  Visit Diagnosis: Acute left-sided low back pain with left-sided sciatica - Plan: PT plan of care cert/re-cert  Abnormal posture - Plan: PT plan of care cert/re-cert  Other symptoms and signs involving the musculoskeletal system - Plan: PT plan of care cert/re-cert     Problem List Patient Active Problem List   Diagnosis Date Noted  . Spondylolisthesis of lumbar region 07/16/2018  . Chronic non-seasonal allergic rhinitis 02/08/2016  . BPH without obstruction/lower urinary tract symptoms 08/17/2015  . Elevated PSA 08/17/2015  . Essential hypertension 02/01/2015  . Hyperlipidemia with target LDL less than 100 02/01/2015      Clarita Crane, PT, DPT 07/17/18 9:55 AM     [facility] [address] [place], [place], [address] Phone: [phone]   Fax:  [phone]  Name: Dillon Jackson MRN: [id number] Date of Birth: March 01, 1950

## 2018-07-17 NOTE — Patient Instructions (Signed)
Access Code: [email or web address]  URL: [email or web address]  Date: 07/17/2018  Prepared by: Moshe Cipro   Exercises  Supine Hamstring Stretch with Strap - 3 reps - 1 sets - 30 sec hold - 2x daily - 7x weekly  Supine Piriformis Stretch with Foot on Ground - 3 reps - 1 sets - 30 sec hold - 2x daily - 7x weekly  Hooklying Single Knee to Chest - 3 reps - 1 sets - 30 sec hold - 2x daily - 7x weekly  Patient Education  Trigger Point Dry Needling

## 2018-07-19 ENCOUNTER — Other Ambulatory Visit: Payer: Self-pay

## 2018-07-19 ENCOUNTER — Ambulatory Visit (INDEPENDENT_AMBULATORY_CARE_PROVIDER_SITE_OTHER): Payer: Medicare Other | Admitting: Physical Therapy

## 2018-07-19 DIAGNOSIS — R29898 Other symptoms and signs involving the musculoskeletal system: Secondary | ICD-10-CM | POA: Diagnosis not present

## 2018-07-19 DIAGNOSIS — R293 Abnormal posture: Secondary | ICD-10-CM

## 2018-07-19 DIAGNOSIS — M5442 Lumbago with sciatica, left side: Secondary | ICD-10-CM | POA: Diagnosis not present

## 2018-07-19 NOTE — Patient Instructions (Addendum)

## 2018-07-19 NOTE — Therapy (Signed)
[facility] [address] [place], [place], [address] Phone: [phone]   Fax:  [phone]  Physical Therapy Treatment  Patient Details  Name: Dillon Jackson MRN: [id number] Date of Birth: 1950/05/09 Referring Provider (PT): Rodolph Bongorey, Evan S, MD   Encounter Date: 07/19/2018  PT End of Session - 07/19/18 1203    Visit Number  2    Number of Visits  12    Date for PT Re-Evaluation  08/28/18    Authorization Type  Medicare/Tricare    PT Start Time  1204    PT Stop Time  1301    PT Time Calculation (min)  57 min    Activity Tolerance  Patient limited by pain    Behavior During Therapy  [facility] for tasks assessed/performed       Past Medical History:  Diagnosis Date  . BPH without obstruction/lower urinary tract symptoms 08/17/2015  . Essential hypertension 02/01/2015  . Hyperlipidemia   . Hyperlipidemia with target LDL less than 100 02/01/2015  . Spondylolisthesis of lumbar region 07/16/2018    No past surgical history on file.  There were no vitals filed for this visit.  Subjective Assessment - 07/19/18 1208    Subjective  Pt reports he feels the same as he did last visit.  His hamstrings are sore from the stretches.      Patient Stated Goals  walk and stand without pain    Currently in Pain?  Yes    Pain Score  9     Pain Location  Back    Pain Orientation  Left    Pain Descriptors / Indicators  Radiating;Dull    Aggravating Factors   standing, walking    Pain Relieving Factors  sitting, heating pad          [facility] PT Assessment - 07/19/18 0001      Assessment   Medical Diagnosis  S39.012A (ICD-10-CM) - Lumbosacral strain, initial encounter    Referring Provider (PT)  Rodolph Bongorey, Evan S, MD    Onset Date/Surgical Date  06/30/18    Next MD Visit  PRN (2 weeks if no improvement)    Prior Therapy  none       [facility] Adult PT Treatment/Exercise - 07/19/18 0001      Self-Care   Self-Care  Other Self-Care Comments     Other Self-Care Comments   Pt educated in self massage with roller stick to LLE and ball to lumbar musculature; pt returned demo with cues.       Lumbar Exercises: Stretches   Passive Hamstring Stretch  Right;Left;2 reps;20 seconds   supine with opp knee flexed   Single Knee to Chest Stretch  Right;2 reps;20 seconds    Hip Flexor Stretch  Left;3 reps;30 seconds;Right;2 reps    Standing Extension  3 reps;5 seconds   to neutral; painful   Standing Extension Limitations       Quad Stretch  Left;Right;2 reps;30 seconds   seated with foot under chair   Piriformis Stretch  Right;Left;2 reps;30 seconds      Lumbar Exercises: Aerobic   Nustep  L5: legs only x 5 min       Lumbar Exercises: Supine   Ab Set  --   3 reps, 5 sec hold   Other Supine Lumbar Exercises  pelvic tilts (ant/post) x 5 reps, within tolerance      Modalities   Modalities  Electrical Stimulation;Moist Heat      Moist Heat Therapy  Number Minutes Moist Heat  12 Minutes    Moist Heat Location  Lumbar Spine   pt in Rt sidelying     Electrical Stimulation   Electrical Stimulation Location  Lt low back    Electrical Stimulation Action  IFC    Electrical Stimulation Parameters  intensity to tolerance    Electrical Stimulation Goals  Tone;Pain      Manual Therapy   Manual Therapy  Manual Traction;Soft tissue mobilization    Manual therapy comments  slides and glides to LLE with pt in hooklying (knee/ hip flex, knee ext, foot DF - 2 rounds)    Soft tissue mobilization  STM and TPR to Lt QL with pt in sidelying    Manual Traction  long axis traction through LLE x 15 sec x 3 reps              PT Education - 07/19/18 1253    Education Details  TENS info, HEP (added hip flexor stretch); self massage     Person(s) Educated  Patient    Methods  Explanation;Handout;Verbal cues;Demonstration    Comprehension  Verbalized understanding;Returned demonstration          PT Long Term Goals - 07/17/18 0952       PT LONG TERM GOAL #1   Title  independent with HEP    Status  New    Target Date  08/28/18      PT LONG TERM GOAL #2   Title  FOTO score improved to </= 35% limited for improved function    Status  New    Target Date  08/28/18      PT LONG TERM GOAL #3   Title  perform lumbar ROM without increase in pain for improved function    Status  New    Target Date  08/28/18      PT LONG TERM GOAL #4   Title  demonstrate improved standing and walking posture without increase in pain for improved function    Status  New    Target Date  08/28/18      PT LONG TERM GOAL #5   Title  report 75% improvement in sleep for improved function    Status  New    Target Date  08/28/18            Plan - 07/19/18 1333    Clinical Impression Statement  Continued reported difficulty coming to upright/neutral position in standing.  Pt reported relief of pain with Lt hip flexor stretch, STM to QL and estim to same area. Pt receptive to and may benefit from DN/ manual therapy to Lt low back/ hip.  Goals are ongoing at this time.      Rehab Potential  Good    PT Frequency  2x / week    PT Duration  6 weeks    PT Treatment/Interventions  ADLs/Self Care Home Management;Cryotherapy;Ultrasound;Traction;Moist Heat;Electrical Stimulation;Gait training;Functional mobility training;Neuromuscular re-education;Therapeutic exercise;Therapeutic activities;Patient/family education;Manual techniques;Dry needling;Taping;Spinal Manipulations    PT Next Visit Plan  lumbar manipulation, DN/manual therapy to Lt glutes/QL/paraspinals, review HEP and progress as able, modalities PRN    PT Home Exercise Plan  Access Code: [email or web address]    Consulted and Agree with Plan of Care  Patient       Patient will benefit from skilled therapeutic intervention in order to improve the following deficits and impairments:  Abnormal gait, Increased fascial restricitons, Increased muscle spasms, Pain, Improper body mechanics, Postural dysfunction,  Decreased mobility, Decreased range of  motion, Impaired flexibility, Difficulty walking  Visit Diagnosis: Acute left-sided low back pain with left-sided sciatica  Abnormal posture  Other symptoms and signs involving the musculoskeletal system     Problem List Patient Active Problem List   Diagnosis Date Noted  . Spondylolisthesis of lumbar region 07/16/2018  . Chronic non-seasonal allergic rhinitis 02/08/2016  . BPH without obstruction/lower urinary tract symptoms 08/17/2015  . Elevated PSA 08/17/2015  . Essential hypertension 02/01/2015  . Hyperlipidemia with target LDL less than 100 02/01/2015   Dillon Jackson, Dillon Jackson 07/19/18 1:37 PM  [facility] [place] [address] [place], [place], [address] Phone: [phone]   Fax:  [phone]  Name: Dillon Jackson MRN: [id number] Date of Birth: [birth year]

## 2018-07-22 ENCOUNTER — Ambulatory Visit (INDEPENDENT_AMBULATORY_CARE_PROVIDER_SITE_OTHER): Payer: Medicare Other | Admitting: Physical Therapy

## 2018-07-22 ENCOUNTER — Other Ambulatory Visit: Payer: Self-pay

## 2018-07-22 ENCOUNTER — Encounter: Payer: Self-pay | Admitting: Physical Therapy

## 2018-07-22 DIAGNOSIS — R293 Abnormal posture: Secondary | ICD-10-CM

## 2018-07-22 DIAGNOSIS — M5442 Lumbago with sciatica, left side: Secondary | ICD-10-CM | POA: Diagnosis present

## 2018-07-22 DIAGNOSIS — R29898 Other symptoms and signs involving the musculoskeletal system: Secondary | ICD-10-CM | POA: Diagnosis not present

## 2018-07-22 NOTE — Therapy (Signed)
[place], [place], [address] Phone: [phone]   Fax:  [phone]  Physical Therapy Treatment  Patient Details  Name: Dillon Jackson MRN: [id number] Date of Birth: 1950-10-04 Referring Provider (PT): Gregor Hams, MD   Encounter Date: 07/22/2018  PT End of Session - 07/22/18 1212    Visit Number  3    Number of Visits  12    Date for PT Re-Evaluation  08/28/18    Authorization Type  Medicare/Tricare    PT Start Time  1130    PT Stop Time  1223    PT Time Calculation (min)  53 min    Activity Tolerance  Patient limited by pain    Behavior During Therapy  [facility] for tasks assessed/performed       Past Medical History:  Diagnosis Date  . BPH without obstruction/lower urinary tract symptoms 08/17/2015  . Essential hypertension 02/01/2015  . Hyperlipidemia   . Hyperlipidemia with target LDL less than 100 02/01/2015  . Spondylolisthesis of lumbar region 07/16/2018    History reviewed. No pertinent surgical history.  There were no vitals filed for this visit.  Subjective Assessment - 07/22/18 1129    Subjective  no better today.  "I'm getting frustrated."  meds and stretches are not beneficial.      Patient Stated Goals  walk and stand without pain    Currently in Pain?  Yes    Pain Score  10-Worst pain ever    Pain Location  Back    Pain Orientation  Left    Pain Descriptors / Indicators  Dull;Radiating    Pain Type  Acute pain    Pain Onset  1 to 4 weeks ago    Pain Frequency  Constant    Aggravating Factors   standing, walking    Pain Relieving Factors  sitting, heating pad                       [facility] Adult PT Treatment/Exercise - 07/22/18 1132      Lumbar Exercises: Stretches   Standing Extension  3 reps;5 seconds   to neutral; improved pain (still elevated)   Piriformis Stretch  Left;2 reps;30 seconds      Lumbar Exercises: Aerobic   Nustep  L5 x 5 min; 4  extremities      Modalities   Modalities  Electrical Stimulation;Moist Heat      Moist Heat Therapy   Number Minutes Moist Heat  15 Minutes    Moist Heat Location  Lumbar Spine   pt in Rt sidelying     Electrical Stimulation   Electrical Stimulation Location  Lt low back/glutes    Electrical Stimulation Action  IFC    Electrical Stimulation Parameters  to tolerance    Electrical Stimulation Goals  Tone;Pain      Manual Therapy   Manual Therapy  Soft tissue mobilization    Manual therapy comments  skilled monitoring and palpation of soft tissue during DN    Soft tissue mobilization  STM and TPR to Lt QL with pt in sidelying       Trigger Point Dry Needling - 07/22/18 0001    Consent Given?  Yes    Education Handout Provided  Yes    Muscles Treated Back/Hip  Gluteus minimus;Gluteus medius;Piriformis;Quadratus lumborum    Electrical Stimulation Performed with Dry Needling  Yes    E-stim with Dry Needling Details  Lt glute  min x 5 min to tolerance    Gluteus Minimus Response  Twitch response elicited;Palpable increased muscle length    Gluteus Medius Response  Twitch response elicited;Palpable increased muscle length    Piriformis Response  Twitch response elicited;Palpable increased muscle length    Quadratus Lumborum Response  Twitch response elicited;Palpable increased muscle length                PT Long Term Goals - 07/17/18 0952      PT LONG TERM GOAL #1   Title  independent with HEP    Status  New    Target Date  08/28/18      PT LONG TERM GOAL #2   Title  FOTO score improved to </= 35% limited for improved function    Status  New    Target Date  08/28/18      PT LONG TERM GOAL #3   Title  perform lumbar ROM without increase in pain for improved function    Status  New    Target Date  08/28/18      PT LONG TERM GOAL #4   Title  demonstrate improved standing and walking posture without increase in pain for improved function    Status  New    Target  Date  08/28/18      PT LONG TERM GOAL #5   Title  report 75% improvement in sleep for improved function    Status  New    Target Date  08/28/18            Plan - 07/22/18 1212    Clinical Impression Statement  Pt arrived reporting no changes or improvement in symtpoms.  DN and manual therapy performed today with pt reporting decreased pain following session (down to "8/10" from "10/10").  Pt slowly progressing with PT, no goals met.    Rehab Potential  Good    PT Frequency  2x / week    PT Duration  6 weeks    PT Treatment/Interventions  ADLs/Self Care Home Management;Cryotherapy;Ultrasound;Traction;Moist Heat;Electrical Stimulation;Gait training;Functional mobility training;Neuromuscular re-education;Therapeutic exercise;Therapeutic activities;Patient/family education;Manual techniques;Dry needling;Taping;Spinal Manipulations    PT Next Visit Plan  lumbar manipulation, assess response to DN/manual therapy to Lt glutes/QL/paraspinals, review HEP and progress as able, modalities PRN    PT Home Exercise Plan  Access Code: [email or web address] and Agree with Plan of Care  Patient       Patient will benefit from skilled therapeutic intervention in order to improve the following deficits and impairments:  Abnormal gait, Increased fascial restricitons, Increased muscle spasms, Pain, Improper body mechanics, Postural dysfunction, Decreased mobility, Decreased range of motion, Impaired flexibility, Difficulty walking  Visit Diagnosis: Acute left-sided low back pain with left-sided sciatica  Abnormal posture  Other symptoms and signs involving the musculoskeletal system     Problem List Patient Active Problem List   Diagnosis Date Noted  . Spondylolisthesis of lumbar region 07/16/2018  . Chronic non-seasonal allergic rhinitis 02/08/2016  . BPH without obstruction/lower urinary tract symptoms 08/17/2015  . Elevated PSA 08/17/2015  . Essential hypertension 02/01/2015  .  Hyperlipidemia with target LDL less than 100 02/01/2015      Laureen Abrahams, PT, DPT 07/22/18 12:14 PM     [facility] [place], [place], [address] Phone: [phone]   Fax:  [phone]  Name: Dillon Jackson MRN: [id number] Date of Birth: Aug 02, 1950

## 2018-07-24 ENCOUNTER — Other Ambulatory Visit: Payer: Self-pay

## 2018-07-24 ENCOUNTER — Ambulatory Visit (INDEPENDENT_AMBULATORY_CARE_PROVIDER_SITE_OTHER): Payer: Medicare Other | Admitting: Rehabilitative and Restorative Service Providers"

## 2018-07-24 ENCOUNTER — Encounter: Payer: Self-pay | Admitting: Physical Therapy

## 2018-07-24 DIAGNOSIS — M5442 Lumbago with sciatica, left side: Secondary | ICD-10-CM

## 2018-07-24 DIAGNOSIS — R29898 Other symptoms and signs involving the musculoskeletal system: Secondary | ICD-10-CM | POA: Diagnosis not present

## 2018-07-24 DIAGNOSIS — R293 Abnormal posture: Secondary | ICD-10-CM | POA: Diagnosis not present

## 2018-07-24 NOTE — Therapy (Signed)
[facility] [place] [address] [place] [address] [place], [place], [address] Phone: [phone]   Fax:  [phone]  Physical Therapy Treatment  Patient Details  Name: Dillon Jackson MRN: [id number] Date of Birth: Nov 22, 1950 Referring Provider (PT): Rodolph Bong, MD   Encounter Date: 07/24/2018  PT End of Session - 07/24/18 0712    Visit Number  4    Number of Visits  12    Date for PT Re-Evaluation  08/28/18    Authorization Type  Medicare/Tricare    PT Start Time  0708    PT Stop Time  0804    PT Time Calculation (min)  56 min    Activity Tolerance  Patient tolerated treatment well       Past Medical History:  Diagnosis Date  . BPH without obstruction/lower urinary tract symptoms 08/17/2015  . Essential hypertension 02/01/2015  . Hyperlipidemia   . Hyperlipidemia with target LDL less than 100 02/01/2015  . Spondylolisthesis of lumbar region 07/16/2018    History reviewed. No pertinent surgical history.  There were no vitals filed for this visit.  Subjective Assessment - 07/24/18 0713    Subjective  Patient reports that the DN "helped a lot". He can stand up better and has less pain.    Currently in Pain?  Yes    Pain Score  5     Pain Location  Back    Pain Orientation  Left    Pain Descriptors / Indicators  Dull;Radiating    Pain Type  Acute pain    Pain Onset  1 to 4 weeks ago    Pain Frequency  Constant                       [facility] Adult PT Treatment/Exercise - 07/24/18 0001      Self-Care   Other Self-Care Comments   education re sleeping positions       Therapeutic Activites    Therapeutic Activities  --   myofacial ball release work standing      Exercises   Exercises  --   diaphragmatic breathing 1-2 min exhale 2x length of inhale      Lumbar Exercises: Stretches   Passive Hamstring Stretch  Right;Left;2 reps;30 seconds   supine with strap    Standing Extension  3 reps;5 seconds   as pt  tolerates - limited mvt -lumbar neutral - hips flexed    Piriformis Stretch  Left;2 reps;30 seconds      Lumbar Exercises: Aerobic   Nustep  L5 x 6 min; 4 extremities      Lumbar Exercises: Standing   Other Standing Lumbar Exercises  mid level doorway stretch to pt tolerance to encourage trunk extension 30 sec x 2 reps; hands overhead in shoulder flexion at doorway 30 sec stretch x 2 reps (some tingling in UE's toward end of stretch but helped "loosen up" the LB)       Lumbar Exercises: Supine   Bent Knee Raise  10 reps   encouraging pt to tighten core    Other Supine Lumbar Exercises  alternate shoulder flexion core engaged x 10 each UE encouraging pt to avoid holding his breath     Other Supine Lumbar Exercises  4 part core 5-8 sec hold to pt tolerance x 10 reps       Moist Heat Therapy   Number Minutes Moist Heat  15 Minutes    Moist Heat Location  Lumbar Spine  pt in Rt sidelying     Electrical Stimulation   Electrical Stimulation Location  bilat lumbar; Lt posterior hip     Electrical Stimulation Action  IFC    Electrical Stimulation Parameters  to tolerance    Electrical Stimulation Goals  Tone;Pain      Manual Therapy   Manual Therapy  --   pt declined manual work or DN today             PT Education - 07/24/18 0757    Education Details  HEP     Person(s) Educated  Patient    Methods  Explanation;Demonstration;Tactile cues;Verbal cues;Handout    Comprehension  Verbalized understanding;Returned demonstration;Verbal cues required;Tactile cues required          PT Long Term Goals - 07/17/18 0952      PT LONG TERM GOAL #1   Title  independent with HEP    Status  New    Target Date  08/28/18      PT LONG TERM GOAL #2   Title  FOTO score improved to </= 35% limited for improved function    Status  New    Target Date  08/28/18      PT LONG TERM GOAL #3   Title  perform lumbar ROM without increase in pain for improved function    Status  New    Target  Date  08/28/18      PT LONG TERM GOAL #4   Title  demonstrate improved standing and walking posture without increase in pain for improved function    Status  New    Target Date  08/28/18      PT LONG TERM GOAL #5   Title  report 75% improvement in sleep for improved function    Status  New    Target Date  08/28/18            Plan - 07/24/18 0713    Clinical Impression Statement  Patient reports good response to Dn with some soreness. Declined DN today due to soreness. Pain continues with patient unable to achieve full upright posture. Some improvement in posture with treatment. Added core stabilization and stretching. Discussed sleeping positions.     Rehab Potential  Good    PT Frequency  2x / week    PT Duration  6 weeks    PT Treatment/Interventions  ADLs/Self Care Home Management;Cryotherapy;Ultrasound;Traction;Moist Heat;Electrical Stimulation;Gait training;Functional mobility training;Neuromuscular re-education;Therapeutic exercise;Therapeutic activities;Patient/family education;Manual techniques;Dry needling;Taping;Spinal Manipulations    PT Next Visit Plan  lumbar manipulation, continue DN/manual therapy to Lt glutes/QL/paraspinals as pt tolerates, review HEP and progress as tolerated, modalities PRN    PT Home Exercise Plan  Access Code: [email or web address]    Consulted and Agree with Plan of Care  Patient       Patient will benefit from skilled therapeutic intervention in order to improve the following deficits and impairments:  Abnormal gait, Increased fascial restricitons, Increased muscle spasms, Pain, Improper body mechanics, Postural dysfunction, Decreased mobility, Decreased range of motion, Impaired flexibility, Difficulty walking  Visit Diagnosis: Acute left-sided low back pain with left-sided sciatica  Abnormal posture  Other symptoms and signs involving the musculoskeletal system     Problem List Patient Active Problem List   Diagnosis Date Noted  .  Spondylolisthesis of lumbar region 07/16/2018  . Chronic non-seasonal allergic rhinitis 02/08/2016  . BPH without obstruction/lower urinary tract symptoms 08/17/2015  . Elevated PSA 08/17/2015  . Essential hypertension 02/01/2015  . Hyperlipidemia with target LDL  less than 100 02/01/2015    Celyn Rober MinionP Holt PT, MPH  07/24/2018, 8:05 AM  [facility] [address] [place], [place], [address] Phone: [phone]   Fax:  [phone]  Name: Erin FullingCaesar E Rosasco MRN: [id number] Date of Birth: 01/05/1951

## 2018-07-24 NOTE — Patient Instructions (Addendum)
Abdominal Bracing With Pelvic Floor (Hook-Lying)    With neutral spine, tighten pelvic floor, suck belly button to back bone; tense muscles in the low back at waist; exhale slowly. 10 sec hold Repeat __10_ times. Do _several__ times a day.progress to do this in sitting; standing; walking and with functional activities    Knee to Chest: Sagittal Plane Stability    Bring one knee up, then return. Be sure pelvis does not rock backward or forward. Keep pelvis still. Lift knee _10__ times, alternating sides. Restabilize pelvis.  Do _1-2__ sets, __2-3_ times per day.   Extremity Flexion (Hook-Lying)    Tighten stomach and slowly lower right arm over head until back begins to arch. Keep trunk rigid. Repeat _10___ times per set. Do __1-2__ sets per session. Do __2-3__ sessions per day.   Stabilization: Diaphragmatic Breathing    Lie with knees bent, feet flat. Place one hand on stomach, other on chest. Breathe deeply through nose, lifting belly hand without any motion of hand on chest. Repeat __several __ times per day   Hands overhead on doorway for stretch to tolerance 20-30 sec hold 3-4 reps

## 2018-07-29 ENCOUNTER — Encounter: Payer: Self-pay | Admitting: Physical Therapy

## 2018-07-29 ENCOUNTER — Other Ambulatory Visit: Payer: Self-pay

## 2018-07-29 ENCOUNTER — Ambulatory Visit (INDEPENDENT_AMBULATORY_CARE_PROVIDER_SITE_OTHER): Payer: Medicare Other | Admitting: Physical Therapy

## 2018-07-29 DIAGNOSIS — M5442 Lumbago with sciatica, left side: Secondary | ICD-10-CM

## 2018-07-29 DIAGNOSIS — R293 Abnormal posture: Secondary | ICD-10-CM | POA: Diagnosis not present

## 2018-07-29 DIAGNOSIS — R29898 Other symptoms and signs involving the musculoskeletal system: Secondary | ICD-10-CM

## 2018-07-29 NOTE — Therapy (Signed)
[facility] [address] [place], [place], [address] Phone: [phone]   Fax:  [phone]  Physical Therapy Treatment  Patient Details  Name: Dillon Jackson MRN: [id number] Date of Birth: 05-14-1950 Referring Provider (PT): Rodolph Bongorey, Evan S, MD   Encounter Date: 07/29/2018  PT End of Session - 07/29/18 0827    Visit Number  5    Number of Visits  12    Date for PT Re-Evaluation  08/28/18    Authorization Type  Medicare/Tricare    PT Start Time  0732    PT Stop Time  0812    PT Time Calculation (min)  40 min    Activity Tolerance  Patient tolerated treatment well    Behavior During Therapy  [facility] assessed/performed       Past Medical History:  Diagnosis Date  . BPH without obstruction/lower urinary tract symptoms 08/17/2015  . Essential hypertension 02/01/2015  . Hyperlipidemia   . Hyperlipidemia with target LDL less than 100 02/01/2015  . Spondylolisthesis of lumbar region 07/16/2018    History reviewed. No pertinent surgical history.  There were no vitals filed for this visit.  Subjective Assessment - 07/29/18 0735    Subjective  has no pain currently; still having some days with sharp pain.  mornings tend to be worse but improved with walking.  heat has been helpful    Patient Stated Goals  walk and stand without pain    Currently in Pain?  No/denies    Pain Onset  1 to 4 weeks ago                       [facility] Treatment/Exercise - 07/29/18 0736      Lumbar Exercises: Stretches   Single Knee to Chest Stretch  Right;Left;2 reps;30 seconds    Piriformis Stretch  Right;Left;2 reps;30 seconds      Lumbar Exercises: Aerobic   Nustep  L5 x 6 min; 4 extremities      Lumbar Exercises: Supine   Bridge  10 reps;3 seconds      Manual Therapy   Manual Therapy  Soft tissue mobilization    Manual therapy comments  skilled monitoring and palpation of soft tissue during DN    Soft  tissue mobilization  STM to Lt glutes       Trigger Point Dry Needling - 07/29/18 0826    Consent Given?  Yes    Education Handout Provided  Previously provided    Muscles Treated Back/Hip  Gluteus medius;Gluteus maximus    Electrical Stimulation Performed with Dry Needling  Yes    E-stim with Dry Needling Details  Lt glute med x 5 min to tolerance    Gluteus Medius Response  Twitch response elicited;Palpable increased muscle length    Gluteus Maximus Response  Twitch response elicited;Palpable increased muscle length                PT Long Term Goals - 07/17/18 0952      PT LONG TERM GOAL #1   Title  independent with HEP    Status  New    Target Date  08/28/18      PT LONG TERM GOAL #2   Title  FOTO score improved to </= 35% limited for improved function    Status  New    Target Date  08/28/18      PT LONG TERM GOAL #3   Title  perform  lumbar ROM without increase in pain for improved function    Status  New    Target Date  08/28/18      PT LONG TERM GOAL #4   Title  demonstrate improved standing and walking posture without increase in pain for improved function    Status  New    Target Date  08/28/18      PT LONG TERM GOAL #5   Title  report 75% improvement in sleep for improved function    Status  New    Target Date  08/28/18            Plan - 07/29/18 0827    Clinical Impression Statement  Pt presents today with reports of no pain at present and no pain at end of session.  Pt with better posture today and reports he's been able to increase activity more since last session.  Still with episodes of pain so symptoms not fully resolved, but centralization present and overall progressing well.    Rehab Potential  Good    PT Frequency  2x / week    PT Duration  6 weeks    PT Treatment/Interventions  ADLs/Self Care Home Management;Cryotherapy;Ultrasound;Traction;Moist Heat;Electrical Stimulation;Gait training;Functional mobility training;Neuromuscular  re-education;Therapeutic exercise;Therapeutic activities;Patient/family education;Manual techniques;Dry needling;Taping;Spinal Manipulations    PT Next Visit Plan  lumbar manipulation, continue DN/manual therapy to Lt glutes/QL/paraspinals as pt tolerates, review HEP and progress as tolerated, modalities PRN    PT Home Exercise Plan  Access Code: [email or web address]    Consulted and Agree with Plan of Care  Patient       Patient will benefit from skilled therapeutic intervention in order to improve the following deficits and impairments:  Abnormal gait, Increased fascial restricitons, Increased muscle spasms, Pain, Improper body mechanics, Postural dysfunction, Decreased mobility, Decreased range of motion, Impaired flexibility, Difficulty walking  Visit Diagnosis: Acute left-sided low back pain with left-sided sciatica  Abnormal posture  Other symptoms and signs involving the musculoskeletal system     Problem List Patient Active Problem List   Diagnosis Date Noted  . Spondylolisthesis of lumbar region 07/16/2018  . Chronic non-seasonal allergic rhinitis 02/08/2016  . BPH without obstruction/lower urinary tract symptoms 08/17/2015  . Elevated PSA 08/17/2015  . Essential hypertension 02/01/2015  . Hyperlipidemia with target LDL less than 100 02/01/2015      Laureen Abrahams, PT, DPT 07/29/18 8:29 AM     [facility] [place], [place], [address] Phone: [phone]   Fax:  [phone]  Name: Dillon Jackson MRN: [id number] Date of Birth: 04/03/1950

## 2018-07-31 ENCOUNTER — Encounter: Payer: Self-pay | Admitting: Physical Therapy

## 2018-07-31 ENCOUNTER — Ambulatory Visit (INDEPENDENT_AMBULATORY_CARE_PROVIDER_SITE_OTHER): Payer: Medicare Other | Admitting: Physical Therapy

## 2018-07-31 ENCOUNTER — Other Ambulatory Visit: Payer: Self-pay

## 2018-07-31 DIAGNOSIS — R29898 Other symptoms and signs involving the musculoskeletal system: Secondary | ICD-10-CM

## 2018-07-31 DIAGNOSIS — R293 Abnormal posture: Secondary | ICD-10-CM | POA: Diagnosis not present

## 2018-07-31 DIAGNOSIS — M5442 Lumbago with sciatica, left side: Secondary | ICD-10-CM | POA: Diagnosis present

## 2018-07-31 NOTE — Therapy (Signed)
[facility] [address] [place], [place], [address] Phone: [phone]   Fax:  [phone]  Physical Therapy Treatment  Patient Details  Name: Dillon FullingCaesar E Trent MRN: [id number] Date of Birth: April 15, 1950 Referring Provider (PT): Rodolph Bongorey, Evan S, MD   Encounter Date: 07/31/2018  PT End of Session - 07/31/18 0824    Visit Number  6    Number of Visits  12    Date for PT Re-Evaluation  08/28/18    Authorization Type  Medicare/Tricare    PT Start Time  0733    PT Stop Time  0814    PT Time Calculation (min)  41 min    Activity Tolerance  Patient tolerated treatment well    Behavior During Therapy  [facility] for tasks assessed/performed       Past Medical History:  Diagnosis Date  . BPH without obstruction/lower urinary tract symptoms 08/17/2015  . Essential hypertension 02/01/2015  . Hyperlipidemia   . Hyperlipidemia with target LDL less than 100 02/01/2015  . Spondylolisthesis of lumbar region 07/16/2018    History reviewed. No pertinent surgical history.  There were no vitals filed for this visit.  Subjective Assessment - 07/31/18 0733    Subjective  only feels a little pinch in the front of hip, symptoms have mostly centralized    Patient Stated Goals  walk and stand without pain    Currently in Pain?  Yes    Pain Score  3     Pain Location  Hip    Pain Orientation  Left    Pain Descriptors / Indicators  Dull   pinching   Pain Type  Acute pain    Pain Onset  1 to 4 weeks ago    Pain Frequency  Constant    Aggravating Factors   standing, walking    Pain Relieving Factors  sitting, heating pad                       [facility] Adult PT Treatment/Exercise - 07/31/18 0736      Self-Care   Other Self-Care Comments   instructed in use of bike tube for self mobilization at home      Lumbar Exercises: Stretches   Hip Flexor Stretch  Left;3 reps;30 seconds   seated   Quad Stretch  Left;3 reps;30 seconds    Quad Stretch Limitations  sidelying with strap      Lumbar Exercises: Aerobic   Nustep  L5 x 6 min; 4 extremities      Lumbar Exercises: Supine   Ab Set  10 reps;5 seconds    AB Set Limitations  mod cues for technique, diastasis (with poof) noted with improper form needing cues to maintain core stability      Manual Therapy   Manual Therapy  Joint mobilization    Manual therapy comments  attempted manual hip flexor stretch with increased LBP due to increased arch in supine - stopped exercise    Joint Mobilization  Lt hip P/A mobs grade 3 10 x 15 sec; LAD grades 2-3 5x15 sec                  PT Long Term Goals - 07/17/18 0952      PT LONG TERM GOAL #1   Title  independent with HEP    Status  New    Target Date  08/28/18      PT LONG TERM GOAL #2   Title  FOTO score improved to </= 35% limited for improved function    Status  New    Target Date  08/28/18      PT LONG TERM GOAL #3   Title  perform lumbar ROM without increase in pain for improved function    Status  New    Target Date  08/28/18      PT LONG TERM GOAL #4   Title  demonstrate improved standing and walking posture without increase in pain for improved function    Status  New    Target Date  08/28/18      PT LONG TERM GOAL #5   Title  report 75% improvement in sleep for improved function    Status  New    Target Date  08/28/18            Plan - 07/31/18 0825    Clinical Impression Statement  Pt reported pain in back and hip resolved with hip mobilizations, but returned when attemping hip flexor stretch in supine.  Provided bike tube and educated on how to perform mobilizations at home.  Overall progressing well with PT with centralization of symptoms and decreased intensity.    Rehab Potential  Good    PT Frequency  2x / week    PT Duration  6 weeks    PT Treatment/Interventions  ADLs/Self Care Home Management;Cryotherapy;Ultrasound;Traction;Moist Heat;Electrical Stimulation;Gait  training;Functional mobility training;Neuromuscular re-education;Therapeutic exercise;Therapeutic activities;Patient/family education;Manual techniques;Dry needling;Taping;Spinal Manipulations    PT Next Visit Plan  continue DN/manual therapy to Lt glutes/QL/paraspinals as pt tolerates, review HEP and progress as tolerated, modalities PRN, may need DN to hip flexors    PT Home Exercise Plan  Access Code: [email or web address]    Consulted and Agree with Plan of Care  Patient       Patient will benefit from skilled therapeutic intervention in order to improve the following deficits and impairments:  Abnormal gait, Increased fascial restricitons, Increased muscle spasms, Pain, Improper body mechanics, Postural dysfunction, Decreased mobility, Decreased range of motion, Impaired flexibility, Difficulty walking  Visit Diagnosis: Acute left-sided low back pain with left-sided sciatica  Abnormal posture  Other symptoms and signs involving the musculoskeletal system     Problem List Patient Active Problem List   Diagnosis Date Noted  . Spondylolisthesis of lumbar region 07/16/2018  . Chronic non-seasonal allergic rhinitis 02/08/2016  . BPH without obstruction/lower urinary tract symptoms 08/17/2015  . Elevated PSA 08/17/2015  . Essential hypertension 02/01/2015  . Hyperlipidemia with target LDL less than 100 02/01/2015      Laureen Abrahams, PT, DPT 07/31/18 8:27 AM     [facility] [place] [place], [place], [address] Phone: [phone]   Fax:  [phone]  Name: SHAWNN BOUILLON MRN: [id number] Date of Birth: 1950/12/03

## 2018-08-07 ENCOUNTER — Ambulatory Visit (INDEPENDENT_AMBULATORY_CARE_PROVIDER_SITE_OTHER): Payer: Medicare Other | Admitting: Physical Therapy

## 2018-08-07 ENCOUNTER — Other Ambulatory Visit: Payer: Self-pay

## 2018-08-07 ENCOUNTER — Encounter: Payer: Self-pay | Admitting: Physical Therapy

## 2018-08-07 DIAGNOSIS — R29898 Other symptoms and signs involving the musculoskeletal system: Secondary | ICD-10-CM | POA: Diagnosis not present

## 2018-08-07 DIAGNOSIS — M5442 Lumbago with sciatica, left side: Secondary | ICD-10-CM | POA: Diagnosis present

## 2018-08-07 DIAGNOSIS — R293 Abnormal posture: Secondary | ICD-10-CM

## 2018-08-07 NOTE — Patient Instructions (Signed)
Access Code: [email or web address]  URL: [email or web address]  Date: 08/07/2018  Prepared by: Faustino Congress   Exercises  Supine Hamstring Stretch with Strap - 3 reps - 1 sets - 30 sec hold - 2x daily - 7x weekly  Supine Piriformis Stretch with Foot on Ground - 3 reps - 1 sets - 30 sec hold - 2x daily - 7x weekly  Hooklying Single Knee to Chest - 3 reps - 1 sets - 30 sec hold - 2x daily - 7x weekly  Supine Double Knee to Chest - 3 reps - 1 sets - 30 sec hold - 2x daily - 7x weekly  Supine Lower Trunk Rotation - 3 reps - 1 sets - 30 sec hold - 2x daily - 7x weekly  Squatting Shoulder Row with Anchored Resistance - 10 reps - 1 sets - 5 sec hold - 2x daily - 7x weekly  Half Dead Lift with Kettlebell - 10 reps - 1 sets - 2x daily - 7x weekly  Dumbbell Squat at Shoulders - 10 reps - 1 sets - 2x daily - 7x weekly  Patient Education  Trigger Point Dry Needling

## 2018-08-07 NOTE — Therapy (Signed)
[place], [place], [address] Phone: [phone]   Fax:  [phone]  Physical Therapy Treatment  Patient Details  Name: Dillon Jackson MRN: [id number] Date of Birth: [birth year] Referring Provider (PT): Gregor Hams, MD   Encounter Date: 08/07/2018  PT End of Session - 08/07/18 1614    Visit Number  7    Number of Visits  12    Date for PT Re-Evaluation  08/28/18    Authorization Type  Medicare/Tricare    PT Start Time  1530    PT Stop Time  1608    PT Time Calculation (min)  38 min    Activity Tolerance  Patient tolerated treatment well    Behavior During Therapy  [facility] for tasks assessed/performed       Past Medical History:  Diagnosis Date  . BPH without obstruction/lower urinary tract symptoms 08/17/2015  . Essential hypertension 02/01/2015  . Hyperlipidemia   . Hyperlipidemia with target LDL less than 100 02/01/2015  . Spondylolisthesis of lumbar region 07/16/2018    History reviewed. No pertinent surgical history.  There were no vitals filed for this visit.  Subjective Assessment - 08/07/18 1531    Subjective  doing much better, feels pain today is about a 1/10.  still stiff and sore in the morning    Patient Stated Goals  walk and stand without pain    Currently in Pain?  Yes    Pain Score  1     Pain Location  Hip    Pain Orientation  Left    Pain Descriptors / Indicators  Dull;Aching    Pain Type  Acute pain    Pain Onset  1 to 4 weeks ago    Pain Frequency  Intermittent    Aggravating Factors   standing, walking    Pain Relieving Factors  sitting, heating pad                       [facility] Adult PT Treatment/Exercise - 08/07/18 1531      Lumbar Exercises: Stretches   Passive Hamstring Stretch  Right;Left;2 reps;30 seconds    Passive Hamstring Stretch Limitations  seated    Single Knee to Chest Stretch  Right;Left;2 reps;30 seconds    Double Knee to Chest  Stretch  2 reps;30 seconds    Lower Trunk Rotation  2 reps;30 seconds    Piriformis Stretch  Right;Left;2 reps;30 seconds    Other Lumbar Stretch Exercise  standing QL stretch in doorway 2x30 sec      Lumbar Exercises: Aerobic   Nustep  L5 x 6 min; 4 extremities      Lumbar Exercises: Standing   Scapular Retraction  Both;10 reps;Theraband    Theraband Level (Scapular Retraction)  Level 3 (Green)    Scapular Retraction Limitations  with TrA engagement    Other Standing Lumbar Exercises  deadlifts (to 6" step height) 2x10; 9#    Other Standing Lumbar Exercises  squat x10 reps; 9# each hand                  PT Long Term Goals - 07/17/18 0938      PT LONG TERM GOAL #1   Title  independent with HEP    Status  New    Target Date  08/28/18      PT LONG TERM GOAL #2   Title  FOTO score improved to </= 35% limited for  improved function    Status  New    Target Date  08/28/18      PT LONG TERM GOAL #3   Title  perform lumbar ROM without increase in pain for improved function    Status  New    Target Date  08/28/18      PT LONG TERM GOAL #4   Title  demonstrate improved standing and walking posture without increase in pain for improved function    Status  New    Target Date  08/28/18      PT LONG TERM GOAL #5   Title  report 75% improvement in sleep for improved function    Status  New    Target Date  08/28/18            Plan - 08/07/18 1615    Clinical Impression Statement  Pt reports significant improvement in pain in low back, and will likely be ready for hold or d/c next visit.  Overall progressing well with PT.    Rehab Potential  Good    PT Frequency  2x / week    PT Duration  6 weeks    PT Treatment/Interventions  ADLs/Self Care Home Management;Cryotherapy;Ultrasound;Traction;Moist Heat;Electrical Stimulation;Gait training;Functional mobility training;Neuromuscular re-education;Therapeutic exercise;Therapeutic activities;Patient/family education;Manual  techniques;Dry needling;Taping;Spinal Manipulations    PT Next Visit Plan  look at goals, discuss hold v d/c at this time if doing well    PT Home Exercise Plan  Access Code: [email or web address]    Consulted and Agree with Plan of Care  Patient       Patient will benefit from skilled therapeutic intervention in order to improve the following deficits and impairments:  Abnormal gait, Increased fascial restricitons, Increased muscle spasms, Pain, Improper body mechanics, Postural dysfunction, Decreased mobility, Decreased range of motion, Impaired flexibility, Difficulty walking  Visit Diagnosis: 1. Acute left-sided low back pain with left-sided sciatica   2. Abnormal posture   3. Other symptoms and signs involving the musculoskeletal system        Problem List Patient Active Problem List   Diagnosis Date Noted  . Spondylolisthesis of lumbar region 07/16/2018  . Chronic non-seasonal allergic rhinitis 02/08/2016  . BPH without obstruction/lower urinary tract symptoms 08/17/2015  . Elevated PSA 08/17/2015  . Essential hypertension 02/01/2015  . Hyperlipidemia with target LDL less than 100 02/01/2015      Dillon CraneStephanie F Lenell Jackson, PT, DPT 08/07/18 4:16 PM     [facility] [address] [place] [address] [place], [place], [address] Phone: [phone]   Fax:  [phone]  Name: Dillon Jackson MRN: [id number] Date of Birth: [birth year]

## 2018-08-09 ENCOUNTER — Encounter: Payer: Self-pay | Admitting: Physical Therapy

## 2018-08-09 ENCOUNTER — Other Ambulatory Visit: Payer: Self-pay

## 2018-08-09 ENCOUNTER — Ambulatory Visit (INDEPENDENT_AMBULATORY_CARE_PROVIDER_SITE_OTHER): Payer: Medicare Other | Admitting: Physical Therapy

## 2018-08-09 DIAGNOSIS — M5442 Lumbago with sciatica, left side: Secondary | ICD-10-CM | POA: Diagnosis present

## 2018-08-09 DIAGNOSIS — R29898 Other symptoms and signs involving the musculoskeletal system: Secondary | ICD-10-CM

## 2018-08-09 DIAGNOSIS — R293 Abnormal posture: Secondary | ICD-10-CM | POA: Diagnosis not present

## 2018-08-09 NOTE — Therapy (Addendum)
[place], [place], [address] Phone: [phone]   Fax:  [phone]  Physical Therapy Treatment/Discharge  Patient Details  Name: Dillon Jackson MRN: [id number] Date of Birth: October 30, 1950 Referring Provider (PT): Gregor Hams, MD   Encounter Date: 08/09/2018  PT End of Session - 08/09/18 1301    Visit Number  8    Number of Visits  12    Date for PT Re-Evaluation  08/28/18    Authorization Type  Medicare/Tricare    PT Start Time  3428    PT Stop Time  7681    PT Time Calculation (min)  38 min    Activity Tolerance  Patient tolerated treatment well;No increased pain    Behavior During Therapy  [facility] for tasks assessed/performed       Past Medical History:  Diagnosis Date  . BPH without obstruction/lower urinary tract symptoms 08/17/2015  . Essential hypertension 02/01/2015  . Hyperlipidemia   . Hyperlipidemia with target LDL less than 100 02/01/2015  . Spondylolisthesis of lumbar region 07/16/2018    No past surgical history on file.  There were no vitals filed for this visit.  Subjective Assessment - 08/09/18 1207    Subjective  "I feel a whole lot better".  Pt reports that he continues to have pain upon waking (3/10, was 8-9/10); it resolves with movement.    Patient Stated Goals  walk and stand without pain    Currently in Pain?  Yes    Pain Score  1     Pain Location  Back    Pain Orientation  Left    Pain Descriptors / Indicators  Dull    Pain Onset  1 to 4 weeks ago    Aggravating Factors   sleeping without a pillow between knees; lift heavy items wrong    Pain Relieving Factors  movement         [facility] PT Assessment - 08/09/18 0001      Assessment   Medical Diagnosis  S39.012A (ICD-10-CM) - Lumbosacral strain, initial encounter    Referring Provider (PT)  Gregor Hams, MD    Onset Date/Surgical Date  06/30/18    Next MD Visit  PRN (2 weeks if no improvement)    Prior  Therapy  none      Observation/Other Assessments   Focus on Therapeutic Outcomes (FOTO)   73 (27% limitation)       AROM   Overall AROM Comments  discomfort with Lt side bend; all other motions without pain.     AROM Assessment Site  Lumbar    Lumbar Flexion  finger tips to shins    Lumbar Extension  to neutral     Lumbar - Right Side Bend  limited 10%    Lumbar - Left Side Bend  limited 20%    Lumbar - Right Rotation  limited 20%    Lumbar - Left Rotation  limited 20%        [facility] Adult PT Treatment/Exercise - 08/09/18 0001      Lumbar Exercises: Stretches   Passive Hamstring Stretch  Right;Left;2 reps;30 seconds   supine   Single Knee to Chest Stretch  Right;Left;30 seconds;1 rep    Double Knee to Chest Stretch  1 rep;20 seconds    Lower Trunk Rotation  2 reps;30 seconds    Hip Flexor Stretch  Right;Left;2 reps;30 seconds   per HEP   Prone on Elbows Stretch  2 reps;10  seconds    Piriformis Stretch  Right;Left;2 reps;30 seconds      Lumbar Exercises: Aerobic   Nustep  L5 x 6 min; 4 extremities      Lumbar Exercises: Standing   Scapular Retraction  Both;10 reps;Theraband   3 sec hold, 2 sets   Theraband Level (Scapular Retraction)  Level 3 (Green)    Scapular Retraction Limitations  with TrA engagement    Other Standing Lumbar Exercises  deadlifts (to 6" step height) 9#    Other Standing Lumbar Exercises  squat x10 reps; 9# each hand        PT Long Term Goals - 08/09/18 1211      PT LONG TERM GOAL #1   Title  independent with HEP    Status  On-going      PT LONG TERM GOAL #2   Title  FOTO score improved to </= 35% limited for improved function    Status  Achieved      PT LONG TERM GOAL #3   Title  perform lumbar ROM without increase in pain for improved function    Status  Partially Met      PT LONG TERM GOAL #4   Title  demonstrate improved standing and walking posture without increase in pain for improved function    Status  Achieved      PT LONG TERM  GOAL #5   Title  report 75% improvement in sleep for improved function    Status  Achieved            Plan - 08/09/18 1215    Clinical Impression Statement  Pt tolerated all exercises without difficulty or increase in symptoms. He has partially met his goals and is pleased with level of function. Pt requests to hold therapy at this time.    Rehab Potential  Good    PT Frequency  2x / week    PT Duration  6 weeks    PT Treatment/Interventions  ADLs/Self Care Home Management;Cryotherapy;Ultrasound;Traction;Moist Heat;Electrical Stimulation;Gait training;Functional mobility training;Neuromuscular re-education;Therapeutic exercise;Therapeutic activities;Patient/family education;Manual techniques;Dry needling;Taping;Spinal Manipulations    PT Next Visit Plan  will hold therapy per pt request.    PT Home Exercise Plan  Access Code: [email or web address]    Consulted and Agree with Plan of Care  Patient       Patient will benefit from skilled therapeutic intervention in order to improve the following deficits and impairments:  Abnormal gait, Increased fascial restricitons, Increased muscle spasms, Pain, Improper body mechanics, Postural dysfunction, Decreased mobility, Decreased range of motion, Impaired flexibility, Difficulty walking  Visit Diagnosis: 1. Acute left-sided low back pain with left-sided sciatica   2. Abnormal posture   3. Other symptoms and signs involving the musculoskeletal system        Problem List Patient Active Problem List   Diagnosis Date Noted  . Spondylolisthesis of lumbar region 07/16/2018  . Chronic non-seasonal allergic rhinitis 02/08/2016  . BPH without obstruction/lower urinary tract symptoms 08/17/2015  . Elevated PSA 08/17/2015  . Essential hypertension 02/01/2015  . Hyperlipidemia with target LDL less than 100 02/01/2015   Kerin Perna, PTA 08/09/18 1:30 PM  [place], [place], [address] Phone: [phone]   Fax:  [phone]  Name: TRACKER MANCE MRN: [id number] Date of Birth: Jul 15, 1950     PHYSICAL THERAPY DISCHARGE SUMMARY  Visits from Start of Care: 8  Current functional level related to goals / functional outcomes: See above  Remaining deficits: n/a   Education / Equipment: HEP  Plan: Patient agrees to discharge.  Patient goals were met. Patient is being discharged due to meeting the stated rehab goals.  ?????     Laureen Abrahams, PT, DPT 09/17/18 1:58 PM  [facility] [place], [place] [address]  [phone] (office) [phone] (fax)

## 2020-01-12 ENCOUNTER — Emergency Department (INDEPENDENT_AMBULATORY_CARE_PROVIDER_SITE_OTHER)
Admission: EM | Admit: 2020-01-12 | Discharge: 2020-01-12 | Disposition: A | Discharge status: Home / Self Care | Payer: Medicare Other | Source: Home / Self Care

## 2020-01-12 ENCOUNTER — Other Ambulatory Visit: Payer: Self-pay

## 2020-01-12 DIAGNOSIS — R059 Cough, unspecified: Secondary | ICD-10-CM

## 2020-01-12 DIAGNOSIS — R0989 Other specified symptoms and signs involving the circulatory and respiratory systems: Secondary | ICD-10-CM | POA: Diagnosis not present

## 2020-01-12 MED ORDER — CETIRIZINE HCL 10 MG PO TABS: 10.0000 mg | ORAL_TABLET | Freq: Every day | ORAL | 0 refills | Status: AC

## 2020-01-12 MED ORDER — MUCINEX MAXIMUM STRENGTH 1200 MG PO TB12: 1.0000 | ORAL_TABLET | Freq: Two times a day (BID) | ORAL | 0 refills | Status: AC | PRN

## 2020-01-12 NOTE — ED Triage Notes (Signed)
Patient presents to Urgent Care with complaints of chest congestion since 3 weeks ago. Patient reports he is able to cough up some of the secretions, states the congestion is better when he lays down at night. Pt has not been tested for covid recently, has been vaccinated for covid.

## 2020-01-12 NOTE — ED Provider Notes (Signed)
[facility]    CSN: [id number] Arrival date & time: 01/12/20  0957      History   Chief Complaint Chief Complaint  Patient presents with  . Nasal Congestion    HPI Dillon Jackson is a 69 y.o. male.   HPI  Patient presents for evaluation of chest congestion upon awakening x 3 weeks. Patient reports upon awaken having to cough to clear mucus from his chest. He has been taking Mucinex which has helped some however he reports that his wife wanted him to come in to be evaluated as this has been going on for approximately 3 weeks. He has a history of chronic rhinitis. Denies any congestion. He is not coughing has not had fever no known exposure to COVID-19. He has no chest congestion during the daytime and feel well. Denies any body aches. He is not currently taking any antihistamine therapy on a consistent basis. Denies any chest pain or shortness of breath. Past Medical History:  Diagnosis Date  . BPH without obstruction/lower urinary tract symptoms 08/17/2015  . Essential hypertension 02/01/2015  . Hyperlipidemia   . Hyperlipidemia with target LDL less than 100 02/01/2015  . Spondylolisthesis of lumbar region 07/16/2018    Patient Active Problem List   Diagnosis Date Noted  . Spondylolisthesis of lumbar region 07/16/2018  . Chronic non-seasonal allergic rhinitis 02/08/2016  . BPH without obstruction/lower urinary tract symptoms 08/17/2015  . Elevated PSA 08/17/2015  . Essential hypertension 02/01/2015  . Hyperlipidemia with target LDL less than 100 02/01/2015    History reviewed. No pertinent surgical history.     Home Medications    Prior to Admission medications   Medication Sig Start Date End Date Taking? Authorizing Provider  amLODipine (NORVASC) 10 MG tablet Take 10 mg by mouth daily.   Yes [provider]  indapamide (LOZOL) 1.25 MG tablet Take 1.25 mg by mouth daily.   Yes [provider]  metFORMIN (GLUCOPHAGE) 500 MG tablet Take  by mouth 2 (two) times daily with a meal.   Yes [provider]  aspirin EC 81 MG tablet Take 81 mg by mouth daily.    [provider]  atorvastatin (LIPITOR) 40 MG tablet Take 40 mg by mouth daily.    [provider]  budesonide-formoterol (SYMBICORT) 80-4.5 MCG/ACT inhaler Inhale 2 puffs into the lungs 2 (two) times daily.    [provider]  cetirizine (ZYRTEC) 10 MG tablet Take 1 tablet (10 mg total) by mouth at bedtime. 01/12/20   Bing Neighbors, FNP  cyclobenzaprine (FLEXERIL) 10 MG tablet Take 1 tablet (10 mg total) by mouth 3 (three) times daily. 07/13/18   Lattie Haw, MD  gabapentin (NEURONTIN) 300 MG capsule One tab PO qHS for a week, then BID for a week, then TID. May double weekly to a max of 3,600mg /day 07/16/18   Rodolph Bong, MD  Guaifenesin [facility] MAXIMUM STRENGTH) 1200 MG TB12 Take 1 tablet (1,200 mg total) by mouth every 12 (twelve) hours as needed for up to 7 days. 01/12/20 01/19/20  Bing Neighbors, FNP  hydrochlorothiazide (HYDRODIURIL) 25 MG tablet Take 25 mg by mouth daily.    [provider]    Family History Family History  Problem Relation Age of Onset  . Hypertension Mother   . Heart attack Father     Social History Social History   Tobacco Use  . Smoking status: Never Smoker  . Smokeless tobacco: Never Used  Vaping Use  .  Vaping Use: Never used  Substance Use Topics  . Alcohol use: Yes    Comment: socially  . Drug use: Not on file     Allergies   Patient has no known allergies.   Review of Systems Review of Systems Pertinent negatives listed in HPI   Physical Exam Triage Vital Signs ED Triage Vitals  Enc Vitals Group     BP 01/12/20 1014 135/78     Pulse Rate 01/12/20 1014 63     Resp 01/12/20 1014 16     Temp 01/12/20 1014 98.6 F (37 C)     Temp Source 01/12/20 1014 Oral     SpO2 01/12/20 1014 98 %     Weight --      Height --      Head Circumference --      Peak Flow --        Pain Score 01/12/20 1010 0     Pain Loc --      Pain Edu? --      Excl. in GC? --    No data found.  Updated Vital Signs BP 135/78 (BP Location: Right Arm)   Pulse 63   Temp 98.6 F (37 C) (Oral)   Resp 16   SpO2 98%   Visual Acuity Right Eye Distance:   Left Eye Distance:   Bilateral Distance:    Right Eye Near:   Left Eye Near:    Bilateral Near:     Physical Exam General appearance: alert, well developed, well nourished, cooperative and in no distress Head: Normocephalic, without obvious abnormality, atraumatic ENT: External ears normal, nares patent, oropharynx normal  Respiratory: Respirations even and unlabored, normal respiratory rate Heart: rate and rhythm normal. No gallop or murmurs noted on exam  Abdomen: BS +, no distention, no rebound tenderness, or no mass Extremities: No gross deformities Skin: Skin color, texture, turgor normal. No rashes seen  Psych: Appropriate mood and affect.  UC Treatments / Results  Labs (all labs ordered are listed, but only abnormal results are displayed) Labs Reviewed - No data to display  EKG   Radiology No results found.  Procedures Procedures (including critical care time)  Medications Ordered in UC Medications - No data to display  Initial Impression / Assessment and Plan / UC Course  I have reviewed the triage vital signs and the nursing notes.  Pertinent labs & imaging results that were available during my care of the patient were reviewed by me and considered in my medical decision making (see chart for details).    Exam findings today are reassuring.  Suspect patient's symptoms are likely related to postnasal drainage settling during the nighttime hours.  Recommended purchasing cetirizine and taken 1 hour prior to bedtime and also purchasing plain Mucinex to take twice daily with an 8 ounce glass of water.  Red flag symptoms discussed.  Patient stable.  He verbalized agreement with plan. Final Clinical  Impressions(s) / UC Diagnoses   Final diagnoses:  Chest congestion  Cough     Discharge Instructions     Take Mucinex twice daily with an 8 ounce glass of water.  Start cetirizine which is an antihistamine which should dry the mucus secretions 1 hour prior to bedtime nightly until symptoms resolve.  Symptoms should resolve with medication within 7 to 10 days.   ED Prescriptions    Medication Sig Dispense Auth. Provider   Guaifenesin [facility] MAXIMUM STRENGTH) 1200 MG TB12 Take 1 tablet (1,200 mg total) by  mouth every 12 (twelve) hours as needed for up to 7 days. 14 tablet Bing Neighbors, FNP   cetirizine (ZYRTEC) 10 MG tablet Take 1 tablet (10 mg total) by mouth at bedtime. 30 tablet Bing Neighbors, FNP     PDMP not reviewed this encounter.   Josede, [place], [place] 01/17/20 [phone]

## 2020-01-12 NOTE — Discharge Instructions (Addendum)
Take Mucinex twice daily with an 8 ounce glass of water.  Start cetirizine which is an antihistamine which should dry the mucus secretions 1 hour prior to bedtime nightly until symptoms resolve.  Symptoms should resolve with medication within 7 to 10 days.

## 2020-01-24 ENCOUNTER — Emergency Department (INDEPENDENT_AMBULATORY_CARE_PROVIDER_SITE_OTHER): Payer: Medicare Other

## 2020-01-24 ENCOUNTER — Other Ambulatory Visit: Payer: Self-pay

## 2020-01-24 ENCOUNTER — Emergency Department (INDEPENDENT_AMBULATORY_CARE_PROVIDER_SITE_OTHER)
Admission: EM | Admit: 2020-01-24 | Discharge: 2020-01-24 | Disposition: A | Discharge status: Home / Self Care | Payer: Medicare Other | Source: Home / Self Care

## 2020-01-24 DIAGNOSIS — R058 Other specified cough: Secondary | ICD-10-CM

## 2020-01-24 DIAGNOSIS — R059 Cough, unspecified: Secondary | ICD-10-CM | POA: Diagnosis not present

## 2020-01-24 DIAGNOSIS — R0981 Nasal congestion: Secondary | ICD-10-CM

## 2020-01-24 DIAGNOSIS — J209 Acute bronchitis, unspecified: Secondary | ICD-10-CM

## 2020-01-24 DIAGNOSIS — R0989 Other specified symptoms and signs involving the circulatory and respiratory systems: Secondary | ICD-10-CM

## 2020-01-24 MED ORDER — AMOXICILLIN 875 MG PO TABS: 875.0000 mg | ORAL_TABLET | Freq: Two times a day (BID) | ORAL | 0 refills | Status: AC

## 2020-01-24 MED ORDER — BENZONATATE 100 MG PO CAPS: 100.0000 mg | ORAL_CAPSULE | Freq: Three times a day (TID) | ORAL | 0 refills | Status: DC | PRN

## 2020-01-24 MED ORDER — PREDNISONE 20 MG PO TABS: 40.0000 mg | ORAL_TABLET | Freq: Every day | ORAL | 0 refills | Status: AC

## 2020-01-24 NOTE — ED Provider Notes (Signed)
[facility]    CSN: [id number] Arrival date & time: 01/24/20  0935      History   Chief Complaint Chief Complaint  Patient presents with  . Nasal Congestion    HPI Dillon Jackson is a 69 y.o. male.   HPI Patient seen in here two weeks ago for congestion which precipitates a productive cough when lying down. He is also experiencing some cough intermittently throughout the day however his greatest concern is when he is lying down ICs been unable to get any form of quality sleep since onset of illness 3 weeks ago.  He reports in the morning the cough and is also productive.  He denies any difficulty breathing or wheezing.  According the patient's medical history he does have a history of chronic nonallergic rhinitis.  Although patient denies any nasal symptoms.  He has been trialed on Mucinex and cetirizine and reports no improvement in symptoms and he completed entire course of medication. Remains afebrile. Fully vaccinated against COVID19  Past Medical History:  Diagnosis Date  . BPH without obstruction/lower urinary tract symptoms 08/17/2015  . Essential hypertension 02/01/2015  . Hyperlipidemia   . Hyperlipidemia with target LDL less than 100 02/01/2015  . Spondylolisthesis of lumbar region 07/16/2018    Patient Active Problem List   Diagnosis Date Noted  . Spondylolisthesis of lumbar region 07/16/2018  . Chronic non-seasonal allergic rhinitis 02/08/2016  . BPH without obstruction/lower urinary tract symptoms 08/17/2015  . Elevated PSA 08/17/2015  . Essential hypertension 02/01/2015  . Hyperlipidemia with target LDL less than 100 02/01/2015    History reviewed. No pertinent surgical history.     Home Medications    Prior to Admission medications   Medication Sig Start Date End Date Taking? Authorizing Provider  amLODipine (NORVASC) 10 MG tablet Take 10 mg by mouth daily.    [provider]  aspirin EC 81 MG tablet Take 81 mg by mouth daily.     [provider]  atorvastatin (LIPITOR) 40 MG tablet Take 40 mg by mouth daily.    [provider]  budesonide-formoterol (SYMBICORT) 80-4.5 MCG/ACT inhaler Inhale 2 puffs into the lungs 2 (two) times daily.    [provider]  cetirizine (ZYRTEC) 10 MG tablet Take 1 tablet (10 mg total) by mouth at bedtime. 01/12/20   Bing Neighbors, FNP  cyclobenzaprine (FLEXERIL) 10 MG tablet Take 1 tablet (10 mg total) by mouth 3 (three) times daily. 07/13/18   Lattie Haw, MD  gabapentin (NEURONTIN) 300 MG capsule One tab PO qHS for a week, then BID for a week, then TID. May double weekly to a max of 3,600mg /day 07/16/18   Rodolph Bong, MD  hydrochlorothiazide (HYDRODIURIL) 25 MG tablet Take 25 mg by mouth daily.    [provider]  indapamide (LOZOL) 1.25 MG tablet Take 1.25 mg by mouth daily.    [provider]  metFORMIN (GLUCOPHAGE) 500 MG tablet Take by mouth 2 (two) times daily with a meal.    [provider]    Family History Family History  Problem Relation Age of Onset  . Hypertension Mother   . Heart attack Father     Social History Social History   Tobacco Use  . Smoking status: Never Smoker  . Smokeless tobacco: Never Used  Vaping Use  . Vaping Use: Never used  Substance Use Topics  . Alcohol use: Yes    Comment: socially  . Drug use: Not on file  Allergies   Patient has no known allergies. Review of Systems Review of Systems Pertinent negatives listed in HPI  Physical Exam Triage Vital Signs ED Triage Vitals  Enc Vitals Group     BP 01/24/20 1016 126/76     Pulse Rate 01/24/20 1016 68     Resp 01/24/20 1016 16     Temp 01/24/20 1016 98.3 F (36.8 C)     Temp Source 01/24/20 1016 Oral     SpO2 01/24/20 1016 98 %     Weight --      Height --      Head Circumference --      Peak Flow --      Pain Score 01/24/20 1015 0     Pain Loc --      Pain Edu? --      Excl. in GC? --    No data  found.  Updated Vital Signs BP 126/76 (BP Location: Right Arm)   Pulse 68   Temp 98.3 F (36.8 C) (Oral)   Resp 16   SpO2 98%   Visual Acuity Right Eye Distance:   Left Eye Distance:   Bilateral Distance:    Right Eye Near:   Left Eye Near:    Bilateral Near:     Physical Exam General appearance: alert, well developed, well nourished, cooperative and in no distress Head: Normocephalic, without obvious abnormality, atraumatic Respiratory: Respirations even and unlabored, normal respiratory rate, no wheezing, crackles or rales  Heart: rate and rhythm normal. No gallop or murmurs noted on exam  Abdomen: BS +, no distention, no rebound tenderness, or no mass Skin: Skin color, texture, turgor normal. No rashes seen  Psych: Appropriate mood and affect. Neurologic: Mental status: Alert, oriented to person, place, and time, thought content appropriate.   UC Treatments / Results  Labs (all labs ordered are listed, but only abnormal results are displayed) Labs Reviewed - No data to display  EKG   Radiology DG Chest 2 View  Result Date: 01/24/2020 CLINICAL DATA:  Patient presents to Urgent Care with complaints of chest and nasal congestion since a few weeks ago. Patient reports now at night when he lays flat, he develops a cough. Was seen 2 weeks ago for same and given meds but they did not help. EXAM: CHEST - 2 VIEW COMPARISON:  None. FINDINGS: The heart size and mediastinal contours are within normal limits. Both lungs are clear. No pleural effusion or pneumothorax. The visualized skeletal structures are unremarkable. IMPRESSION: No active cardiopulmonary disease. Electronically Signed   By: Amie Portland M.D.   On: 01/24/2020 11:10    Procedures Procedures (including critical care time)  Medications Ordered in UC Medications - No data to display  Initial Impression / Assessment and Plan / UC Course  I have reviewed the triage vital signs and the nursing notes.  Pertinent  labs & imaging results that were available during my care of the patient were reviewed by me and considered in my medical decision making (see chart for details).    Treating for presumptive bronchitis, although CXR normal. Patient completed three weeks of conservative therapy and remains symptomatic.  Will trial treatment per discharge medications below.  Patient advised at this point to follow-up with primary care provider for referral to an asthma allergy specialist and/or pulmonologist if this course of treatment does not relieve the symptoms.  Final Clinical Impressions(s) / UC Diagnoses   Final diagnoses:  Productive cough  Chest congestion  Acute bronchitis,  unspecified organism     Discharge Instructions     Chest x-ray is negative. Recommend follow-up with pulmonary and asthma/ allergy specialist if symptoms do not improve.  Will trial a course steroids and antibiotics and treat for an acute bronchitis. If no improvement, at that follow-up with pulmonology.  Discontinue Mucinex and cetirizine while taking this medication.    ED Prescriptions    Medication Sig Dispense Auth. Provider   predniSONE (DELTASONE) 20 MG tablet Take 2 tablets (40 mg total) by mouth daily with breakfast. 10 tablet Bing Neighbors, FNP   amoxicillin (AMOXIL) 875 MG tablet Take 1 tablet (875 mg total) by mouth 2 (two) times daily. 7 tablet Bing Neighbors, FNP   benzonatate (TESSALON) 100 MG capsule Take 1-2 capsules (100-200 mg total) by mouth 3 (three) times daily as needed for cough. 40 capsule Bing Neighbors, FNP     PDMP not reviewed this encounter.   Devante, [place]apan[place], FNP 01/29/20 1328

## 2020-01-24 NOTE — ED Triage Notes (Signed)
Patient presents to Urgent Care with complaints of chest and nasal congestion since a few weeks ago. Patient reports now at night when he lays flat, he develops a cough. Was seen 2 weeks ago for same and given meds but they did not help.

## 2020-01-24 NOTE — Discharge Instructions (Addendum)
Chest x-ray is negative. Recommend follow-up with pulmonary and asthma/ allergy specialist if symptoms do not improve.  Will trial a course steroids and antibiotics and treat for an acute bronchitis. If no improvement, at that follow-up with pulmonology.  Discontinue Mucinex and cetirizine while taking this medication.

## 2021-02-05 ENCOUNTER — Emergency Department (INDEPENDENT_AMBULATORY_CARE_PROVIDER_SITE_OTHER)
Admission: EM | Admit: 2021-02-05 | Discharge: 2021-02-05 | Disposition: A | Discharge status: Home / Self Care | Payer: Medicare Other | Source: Home / Self Care

## 2021-02-05 ENCOUNTER — Other Ambulatory Visit: Payer: Self-pay

## 2021-02-05 DIAGNOSIS — R059 Cough, unspecified: Secondary | ICD-10-CM | POA: Diagnosis not present

## 2021-02-05 DIAGNOSIS — J309 Allergic rhinitis, unspecified: Secondary | ICD-10-CM | POA: Diagnosis not present

## 2021-02-05 DIAGNOSIS — J0100 Acute maxillary sinusitis, unspecified: Secondary | ICD-10-CM | POA: Diagnosis not present

## 2021-02-05 MED ORDER — CEFDINIR 300 MG PO CAPS: 300.0000 mg | ORAL_CAPSULE | Freq: Two times a day (BID) | ORAL | 0 refills | Status: AC

## 2021-02-05 MED ORDER — BENZONATATE 200 MG PO CAPS: 200.0000 mg | ORAL_CAPSULE | Freq: Three times a day (TID) | ORAL | 0 refills | Status: AC | PRN

## 2021-02-05 MED ORDER — FEXOFENADINE HCL 180 MG PO TABS: 180.0000 mg | ORAL_TABLET | Freq: Every day | ORAL | 0 refills | Status: AC

## 2021-02-05 NOTE — ED Triage Notes (Signed)
Pt states that he has a cough and some chest congestion. X2 days   Pt states that he is vaccinated for covid.  Pt states that he has had flu vaccine.

## 2021-02-05 NOTE — ED Provider Notes (Signed)
[facility]    CSN: [id number] Arrival date & time: 02/05/21  0902      History   Chief Complaint Chief Complaint  Patient presents with   Cough    Cough, and chest congestion. 2 days    HPI Dillon Jackson is a 70 y.o. male.   HPI 70 year old male presents with cough and chest congestion for 2 days.  PMH significant for HTN.  Past Medical History:  Diagnosis Date   BPH without obstruction/lower urinary tract symptoms 08/17/2015   Essential hypertension 02/01/2015   Hyperlipidemia    Hyperlipidemia with target LDL less than 100 02/01/2015   Spondylolisthesis of lumbar region 07/16/2018    Patient Active Problem List   Diagnosis Date Noted   Spondylolisthesis of lumbar region 07/16/2018   Chronic non-seasonal allergic rhinitis 02/08/2016   BPH without obstruction/lower urinary tract symptoms 08/17/2015   Elevated PSA 08/17/2015   Essential hypertension 02/01/2015   Hyperlipidemia with target LDL less than 100 02/01/2015    History reviewed. No pertinent surgical history.     Home Medications    Prior to Admission medications   Medication Sig Start Date End Date Taking? Authorizing Provider  amLODipine (NORVASC) 10 MG tablet Take 10 mg by mouth daily.   Yes [provider]  aspirin EC 81 MG tablet Take 81 mg by mouth daily.   Yes [provider]  atorvastatin (LIPITOR) 40 MG tablet Take 40 mg by mouth daily.   Yes [provider]  atorvastatin (LIPITOR) 80 MG tablet  04/16/20  Yes [provider]  benzonatate (TESSALON) 200 MG capsule Take 1 capsule (200 mg total) by mouth 3 (three) times daily as needed for up to 7 days for cough. 02/05/21 02/12/21 Yes Trevor Iha, FNP  budesonide-formoterol (SYMBICORT) 80-4.5 MCG/ACT inhaler Inhale 2 puffs into the lungs 2 (two) times daily.   Yes [provider]  cefdinir (OMNICEF) 300 MG capsule Take 1 capsule (300 mg total) by mouth 2 (two) times daily for 7 days.  02/05/21 02/12/21 Yes Trevor Iha, FNP  fexofenadine [facility] ALLERGY) 180 MG tablet Take 1 tablet (180 mg total) by mouth daily for 15 days. 02/05/21 02/20/21 Yes Trevor Iha, FNP  indapamide (LOZOL) 1.25 MG tablet Take 1.25 mg by mouth daily.   Yes [provider]  metFORMIN (GLUCOPHAGE) 500 MG tablet Take by mouth 2 (two) times daily with a meal.   Yes [provider]  timolol (TIMOPTIC) 0.5 % ophthalmic solution  09/08/20  Yes [provider]  amoxicillin (AMOXIL) 875 MG tablet Take 1 tablet (875 mg total) by mouth 2 (two) times daily. 01/24/20   Bing Neighbors, FNP  cetirizine (ZYRTEC) 10 MG tablet Take 1 tablet (10 mg total) by mouth at bedtime. 01/12/20   Bing Neighbors, FNP  cyclobenzaprine (FLEXERIL) 10 MG tablet Take 1 tablet (10 mg total) by mouth 3 (three) times daily. 07/13/18   Lattie Haw, MD  gabapentin (NEURONTIN) 300 MG capsule One tab PO qHS for a week, then BID for a week, then TID. May double weekly to a max of 3,600mg /day 07/16/18   Rodolph Bong, MD  hydrochlorothiazide (HYDRODIURIL) 25 MG tablet Take 25 mg by mouth daily.    [provider]  predniSONE (DELTASONE) 20 MG tablet Take 2 tablets (40 mg total) by mouth daily with breakfast. 01/24/20   Bing Neighbors, FNP    Family History Family History  Problem Relation Age of Onset   Hypertension Mother  Heart attack Father     Social History Social History   Tobacco Use   Smoking status: Never   Smokeless tobacco: Never  Vaping Use   Vaping Use: Never used  Substance Use Topics   Alcohol use: Yes    Comment: socially     Allergies   Patient has no known allergies.   Review of Systems Review of Systems  HENT:  Positive for congestion.   Respiratory:         Chest congestion x 2 days  All other systems reviewed and are negative.   Physical Exam Triage Vital Signs ED Triage Vitals  Enc Vitals Group     BP 02/05/21 0924 133/80     Pulse Rate  02/05/21 0924 67     Resp 02/05/21 0924 20     Temp 02/05/21 0924 98.5 F (36.9 C)     Temp Source 02/05/21 0924 Oral     SpO2 02/05/21 0924 97 %     Weight 02/05/21 0920 225 lb (102.1 kg)     Height 02/05/21 0920 6' (1.829 m)     Head Circumference --      Peak Flow --      Pain Score 02/05/21 0920 0     Pain Loc --      Pain Edu? --      Excl. in GC? --    No data found.  Updated Vital Signs BP 133/80 (BP Location: Right Arm)    Pulse 67    Temp 98.5 F (36.9 C) (Oral)    Resp 20    Ht 6' (1.829 m)    Wt 225 lb (102.1 kg)    SpO2 97%    BMI 30.52 kg/m      Physical Exam Vitals and nursing note reviewed.  Constitutional:      General: He is not in acute distress.    Appearance: Normal appearance. He is obese. He is not ill-appearing.  HENT:     Head: Normocephalic and atraumatic.     Right Ear: Tympanic membrane, ear canal and external ear normal.     Left Ear: Tympanic membrane, ear canal and external ear normal.     Nose: Nose normal.     Mouth/Throat:     Mouth: Mucous membranes are moist.     Pharynx: Oropharynx is clear.  Eyes:     Extraocular Movements: Extraocular movements intact.     Conjunctiva/sclera: Conjunctivae normal.     Pupils: Pupils are equal, round, and reactive to light.  Cardiovascular:     Rate and Rhythm: Normal rate and regular rhythm.     Pulses: Normal pulses.     Heart sounds: Normal heart sounds.  Pulmonary:     Effort: Pulmonary effort is normal.     Breath sounds: Normal breath sounds.  Musculoskeletal:        General: Normal range of motion.     Cervical back: Normal range of motion and neck supple.  Skin:    General: Skin is warm and dry.  Neurological:     General: No focal deficit present.     Mental Status: He is alert and oriented to person, place, and time.     UC Treatments / Results  Labs (all labs ordered are listed, but only abnormal results are displayed) Labs Reviewed - No data to  display  EKG   Radiology No results found.  Procedures Procedures (including critical care time)  Medications Ordered in UC Medications -  No data to display  Initial Impression / Assessment and Plan / UC Course  I have reviewed the triage vital signs and the nursing notes.  Pertinent labs & imaging results that were available during my care of the patient were reviewed by me and considered in my medical decision making (see chart for details).     MDM: 1.  Acute maxillary sinusitis, recurrence not specified-Cefdinir; 2.  Allergic rhinitis-Rx'd Allegra; 3.  Cough-Rx'd Tessalon Perles. Advised patient to take medication as directed with food to completion.  Advised patient to take Allegra with first dose of cefdinir for 5 of 7 days.  Advised patient may use Allegra afterwards as needed for concurrent postnasal drip/drainage.  Advised patient may take Tessalon Perles daily or as needed for cough.  Encouraged patient to increase daily water intake while taking these medications.  Patient discharged home, hemodynamically stable. Final Clinical Impressions(s) / UC Diagnoses   Final diagnoses:  Acute maxillary sinusitis, recurrence not specified  Cough, unspecified type  Allergic rhinitis, unspecified seasonality, unspecified trigger     Discharge Instructions      Advised patient to take medication as directed with food to completion.  Advised patient to take Allegra with first dose of cefdinir for 5 of 7 days.  Advised patient may use Allegra afterwards as needed for concurrent postnasal drip/drainage.  Advised patient may take Tessalon Perles daily or as needed for cough.  Encouraged patient to increase daily water intake while taking these medications.     ED Prescriptions     Medication Sig Dispense Auth. Provider   cefdinir (OMNICEF) 300 MG capsule Take 1 capsule (300 mg total) by mouth 2 (two) times daily for 7 days. 14 capsule Trevor Iha, FNP   fexofenadine [facility]  ALLERGY) 180 MG tablet Take 1 tablet (180 mg total) by mouth daily for 15 days. 15 tablet Trevor Iha, FNP   benzonatate (TESSALON) 200 MG capsule Take 1 capsule (200 mg total) by mouth 3 (three) times daily as needed for up to 7 days for cough. 40 capsule Trevor Iha, FNP      PDMP not reviewed this encounter.   Trevor Iha, FNP 02/05/21 [phone]

## 2021-02-05 NOTE — Discharge Instructions (Addendum)
Advised patient to take medication as directed with food to completion.  Advised patient to take Allegra with first dose of cefdinir for 5 of 7 days.  Advised patient may use Allegra afterwards as needed for concurrent postnasal drip/drainage.  Advised patient may take Tessalon Perles daily or as needed for cough.  Encouraged patient to increase daily water intake while taking these medications.

## 2021-09-04 IMAGING — DX DG CHEST 2V
2 series · 2 of 2 positions shown · non-contrast
Comparison: None.

CLINICAL DATA: Patient presents to [HOSPITAL] with complaints of
chest and nasal congestion since a few weeks ago. Patient reports
now at night when he lays flat, he develops a cough. Was seen 2
weeks ago for same and given meds but they did not help.

EXAM:
CHEST - 2 VIEW

[chest pa]
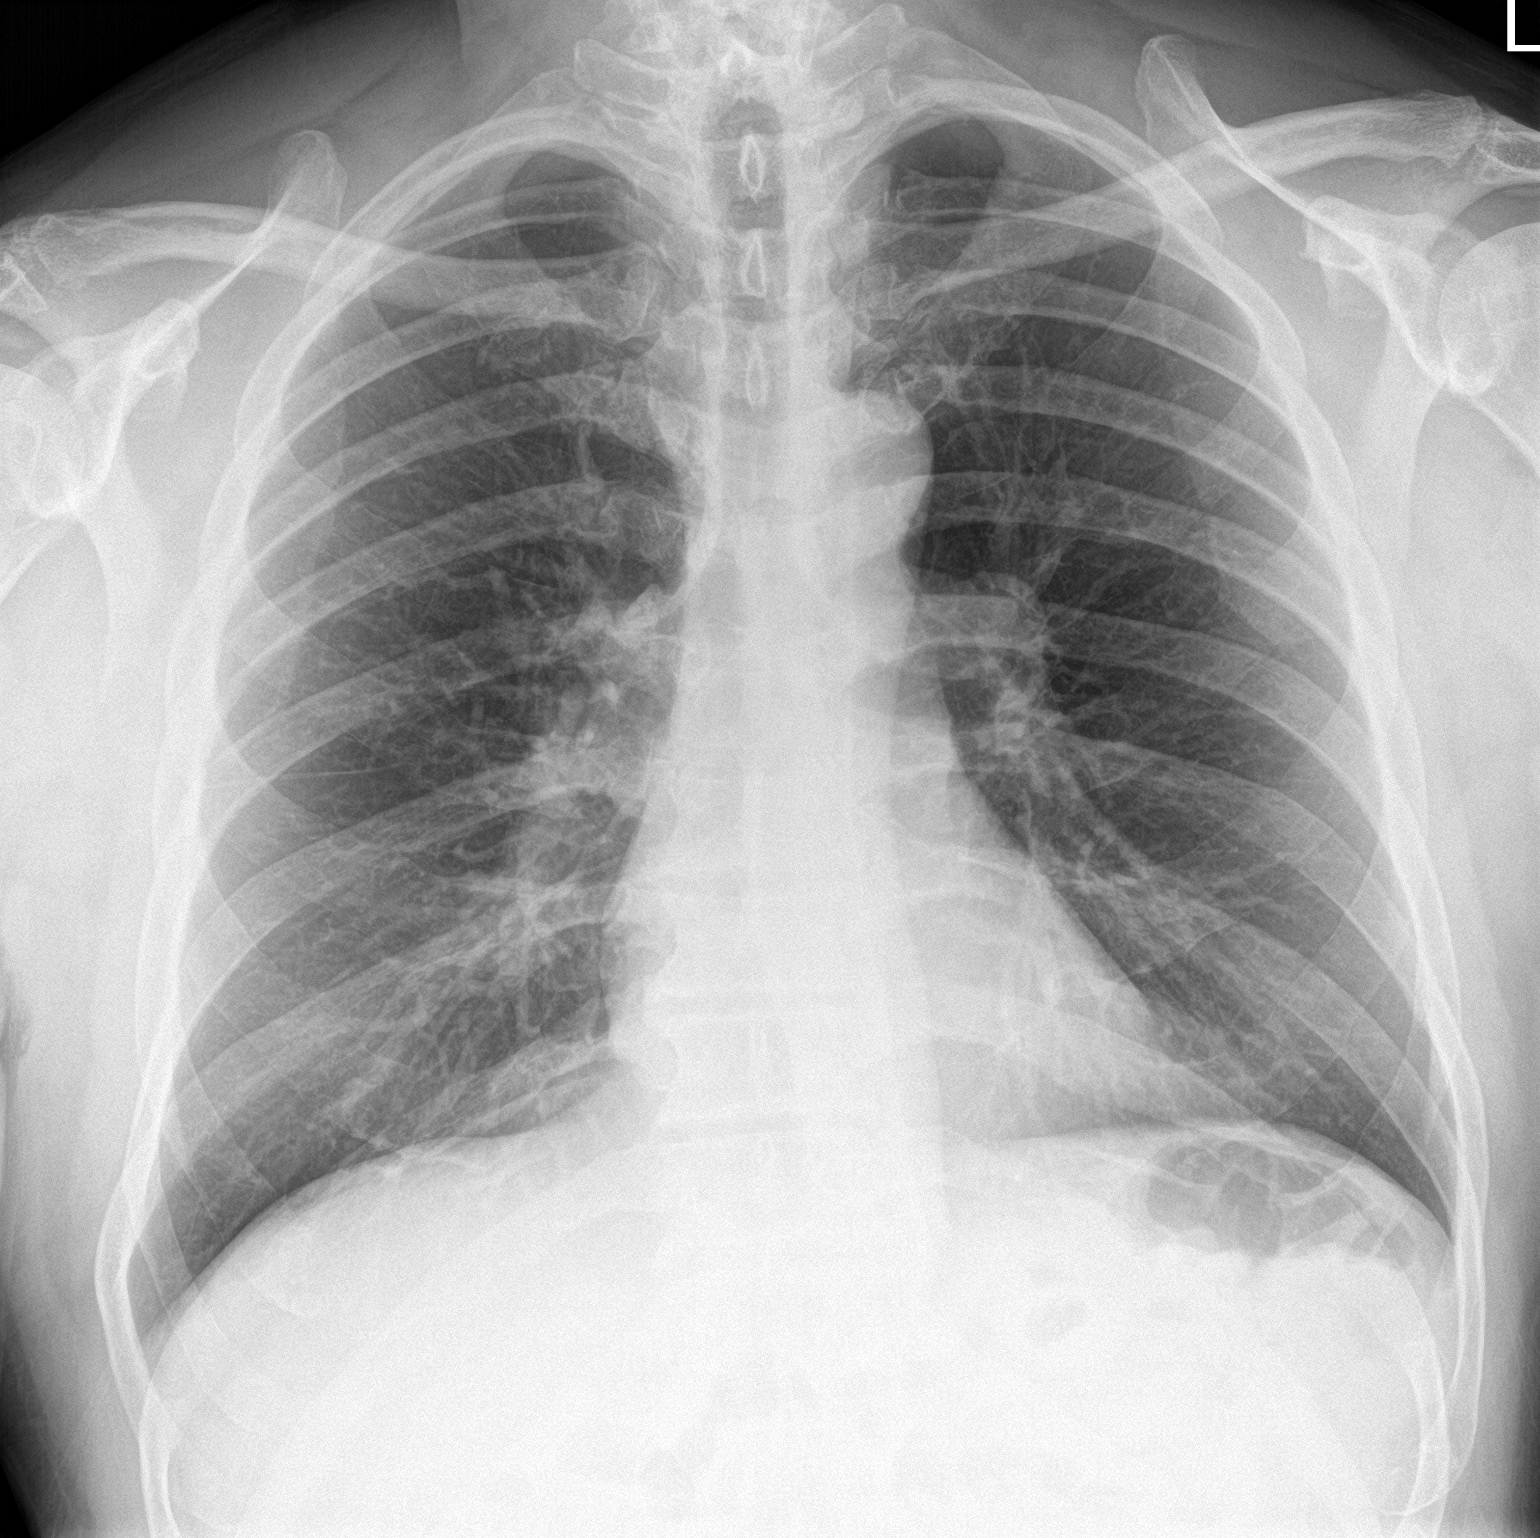

[chest lat]
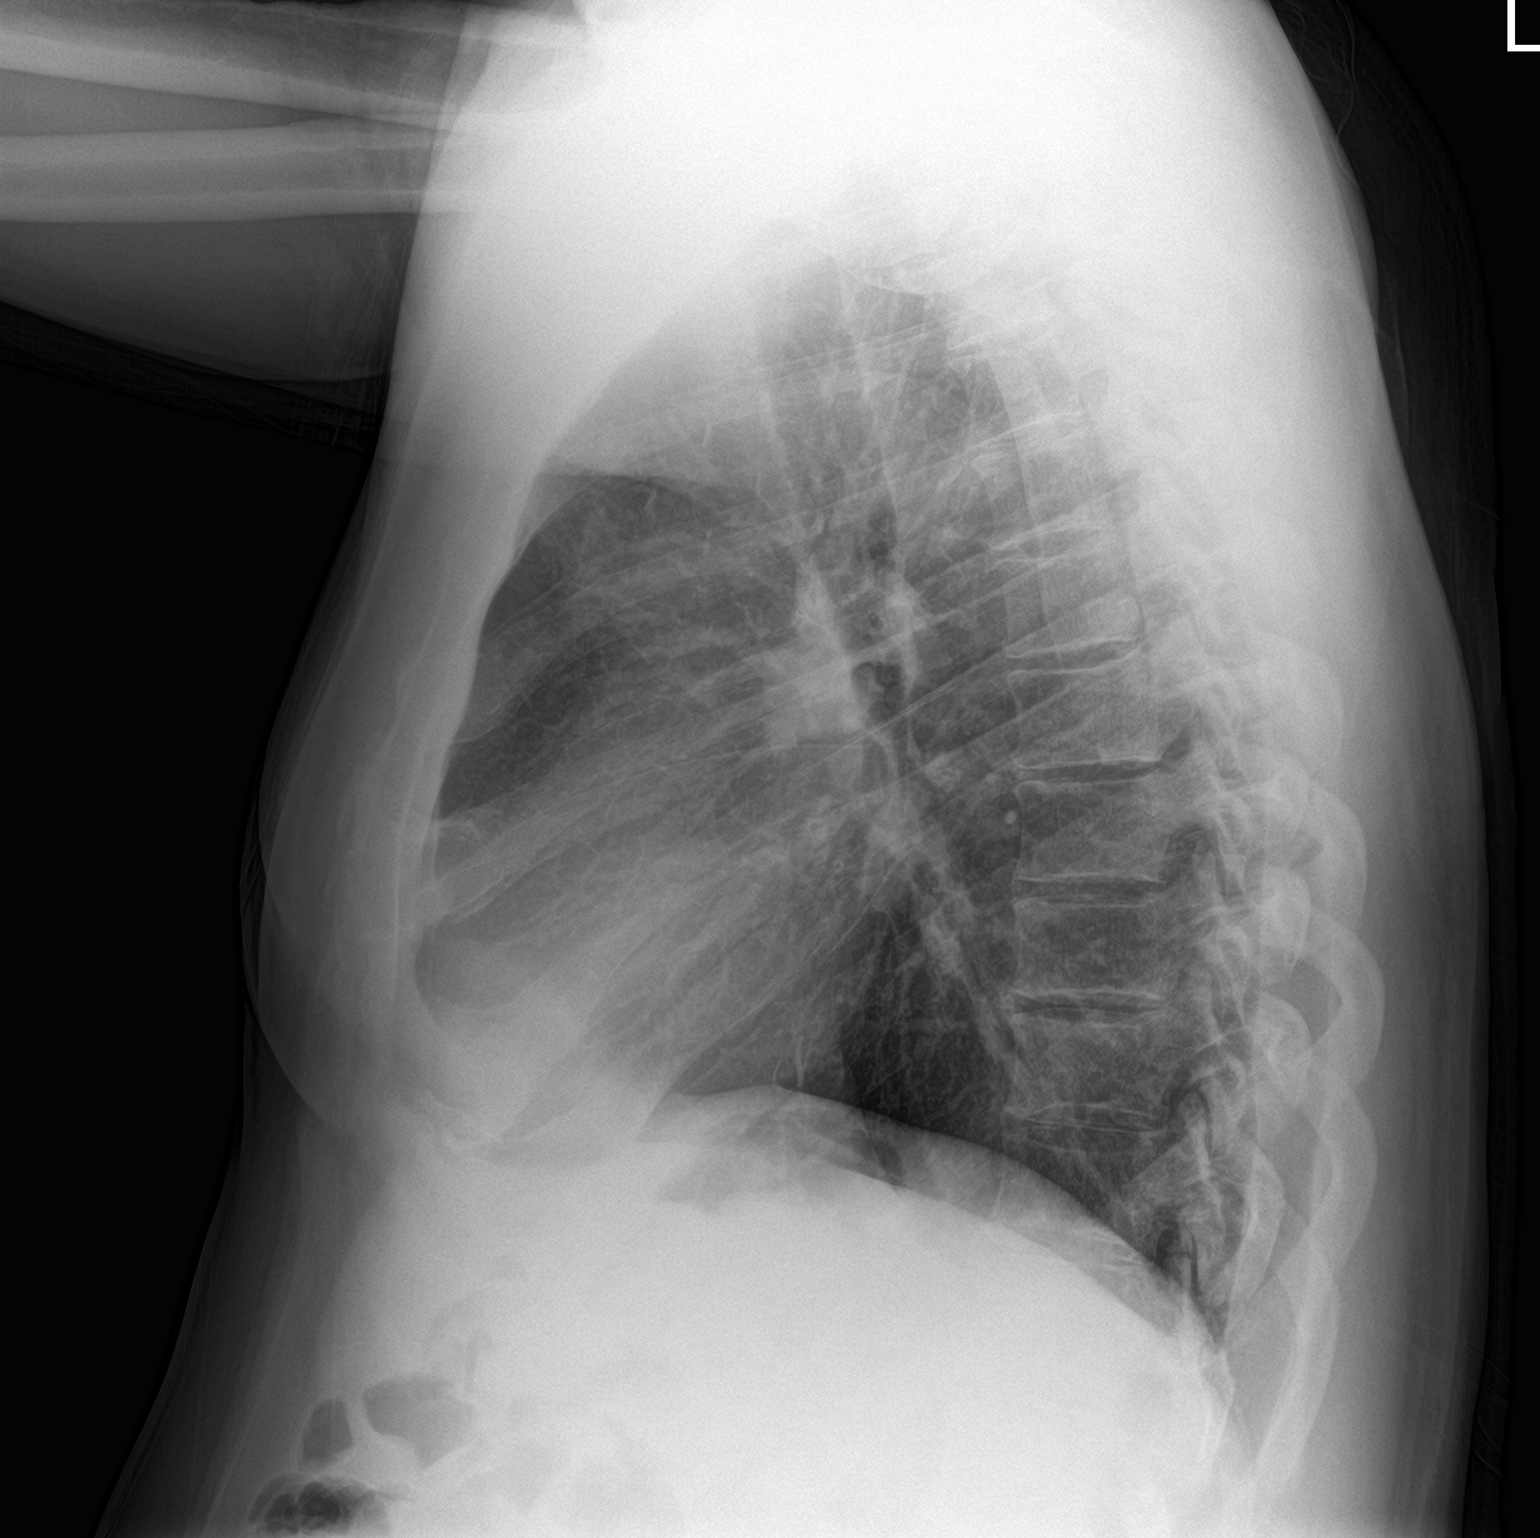

[2 of 2 positions shown; findings below may reference images not displayed]

FINDINGS: The heart size and mediastinal contours are within normal limits.
Both lungs are clear. No pleural effusion or pneumothorax. The
visualized skeletal structures are unremarkable.
IMPRESSION: No active cardiopulmonary disease.
